# Patient Record
Sex: Male | Born: 1955 | Race: White | Hispanic: No | Marital: Married | Smoking: Former smoker
Health system: Southern US, Community
[De-identification: ages and names within clinical notes are randomized; demographics above are authoritative.]

## PROBLEM LIST (undated history)

## (undated) DIAGNOSIS — K7689 Other specified diseases of liver: Secondary | ICD-10-CM

## (undated) DIAGNOSIS — I2510 Atherosclerotic heart disease of native coronary artery without angina pectoris: Secondary | ICD-10-CM

## (undated) DIAGNOSIS — K5730 Diverticulosis of large intestine without perforation or abscess without bleeding: Secondary | ICD-10-CM

## (undated) DIAGNOSIS — M259 Joint disorder, unspecified: Secondary | ICD-10-CM

## (undated) DIAGNOSIS — K635 Polyp of colon: Secondary | ICD-10-CM

## (undated) DIAGNOSIS — B019 Varicella without complication: Secondary | ICD-10-CM

## (undated) DIAGNOSIS — M1990 Unspecified osteoarthritis, unspecified site: Secondary | ICD-10-CM

## (undated) DIAGNOSIS — N281 Cyst of kidney, acquired: Secondary | ICD-10-CM

## (undated) DIAGNOSIS — I809 Phlebitis and thrombophlebitis of unspecified site: Secondary | ICD-10-CM

## (undated) DIAGNOSIS — G4730 Sleep apnea, unspecified: Secondary | ICD-10-CM

## (undated) DIAGNOSIS — G43909 Migraine, unspecified, not intractable, without status migrainosus: Secondary | ICD-10-CM

## (undated) DIAGNOSIS — E7800 Pure hypercholesterolemia, unspecified: Secondary | ICD-10-CM

## (undated) DIAGNOSIS — I119 Hypertensive heart disease without heart failure: Secondary | ICD-10-CM

## (undated) DIAGNOSIS — K2270 Barrett's esophagus without dysplasia: Secondary | ICD-10-CM

## (undated) DIAGNOSIS — I10 Essential (primary) hypertension: Secondary | ICD-10-CM

## (undated) DIAGNOSIS — T7840XA Allergy, unspecified, initial encounter: Secondary | ICD-10-CM

## (undated) DIAGNOSIS — Z8619 Personal history of other infectious and parasitic diseases: Secondary | ICD-10-CM

## (undated) DIAGNOSIS — K219 Gastro-esophageal reflux disease without esophagitis: Secondary | ICD-10-CM

## (undated) HISTORY — DX: Personal history of other infectious and parasitic diseases: Z86.19

## (undated) HISTORY — PX: APPENDECTOMY: SHX54

## (undated) HISTORY — DX: Cyst of kidney, acquired: N28.1

## (undated) HISTORY — DX: Diverticulosis of large intestine without perforation or abscess without bleeding: K57.30

## (undated) HISTORY — PX: CORONARY ANGIOPLASTY WITH STENT PLACEMENT: SHX49

## (undated) HISTORY — PX: ESOPHAGOGASTRODUODENOSCOPY: SHX1529

## (undated) HISTORY — PX: CHOLECYSTECTOMY: SHX55

## (undated) HISTORY — PX: COLONOSCOPY: SHX174

## (undated) HISTORY — DX: Polyp of colon: K63.5

## (undated) HISTORY — PX: CARDIAC CATHETERIZATION: SHX172

---
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
## 2004-02-15 ENCOUNTER — Ambulatory Visit: Payer: Self-pay | Admitting: Internal Medicine

## 2006-01-28 ENCOUNTER — Other Ambulatory Visit: Payer: Self-pay

## 2006-01-28 ENCOUNTER — Inpatient Hospital Stay: Payer: Self-pay | Admitting: Internal Medicine

## 2006-01-28 DIAGNOSIS — I2510 Atherosclerotic heart disease of native coronary artery without angina pectoris: Secondary | ICD-10-CM

## 2006-01-28 HISTORY — PX: CORONARY STENT INTERVENTION: CATH118234

## 2006-01-28 HISTORY — DX: Atherosclerotic heart disease of native coronary artery without angina pectoris: I25.10

## 2006-01-28 HISTORY — PX: LEFT HEART CATH AND CORONARY ANGIOGRAPHY: CATH118249

## 2006-01-29 ENCOUNTER — Other Ambulatory Visit: Payer: Self-pay

## 2006-03-03 ENCOUNTER — Encounter: Payer: Self-pay | Admitting: Cardiology

## 2006-03-30 ENCOUNTER — Encounter: Payer: Self-pay | Admitting: Cardiology

## 2006-04-30 ENCOUNTER — Encounter: Payer: Self-pay | Admitting: Cardiology

## 2006-05-29 ENCOUNTER — Encounter: Payer: Self-pay | Admitting: Cardiology

## 2006-06-29 ENCOUNTER — Encounter: Payer: Self-pay | Admitting: Cardiology

## 2006-09-17 ENCOUNTER — Ambulatory Visit: Payer: Self-pay | Admitting: Internal Medicine

## 2006-10-29 ENCOUNTER — Ambulatory Visit: Payer: Self-pay | Admitting: Cardiology

## 2007-01-29 HISTORY — PX: CHOLECYSTECTOMY: SHX55

## 2007-02-17 ENCOUNTER — Ambulatory Visit: Payer: Self-pay | Admitting: Surgery

## 2007-11-29 HISTORY — PX: COLONOSCOPY: SHX174

## 2007-12-16 ENCOUNTER — Ambulatory Visit: Payer: Self-pay | Admitting: Gastroenterology

## 2008-06-12 ENCOUNTER — Observation Stay: Payer: Self-pay | Admitting: Internal Medicine

## 2009-05-09 ENCOUNTER — Ambulatory Visit: Payer: Self-pay | Admitting: Gastroenterology

## 2009-07-16 ENCOUNTER — Encounter: Payer: Self-pay | Admitting: Sports Medicine

## 2009-07-28 ENCOUNTER — Encounter: Payer: Self-pay | Admitting: Sports Medicine

## 2009-11-08 ENCOUNTER — Ambulatory Visit: Payer: Self-pay | Admitting: Gastroenterology

## 2009-11-11 LAB — PATHOLOGY REPORT

## 2009-12-19 ENCOUNTER — Ambulatory Visit: Payer: Self-pay | Admitting: Cardiology

## 2011-01-19 ENCOUNTER — Ambulatory Visit: Payer: Self-pay | Admitting: Internal Medicine

## 2011-01-22 ENCOUNTER — Ambulatory Visit: Payer: Self-pay | Admitting: Cardiology

## 2011-02-18 ENCOUNTER — Other Ambulatory Visit: Payer: Self-pay | Admitting: Gastroenterology

## 2011-03-16 ENCOUNTER — Ambulatory Visit: Payer: Self-pay | Admitting: Gastroenterology

## 2012-03-04 ENCOUNTER — Ambulatory Visit: Payer: Self-pay | Admitting: Gastroenterology

## 2013-01-06 ENCOUNTER — Ambulatory Visit: Payer: Self-pay | Admitting: Gastroenterology

## 2013-01-06 HISTORY — PX: UPPER GI ENDOSCOPY: SHX6162

## 2013-01-16 LAB — PATHOLOGY REPORT

## 2015-03-04 ENCOUNTER — Encounter: Payer: Self-pay | Admitting: *Deleted

## 2015-03-05 ENCOUNTER — Ambulatory Visit: Payer: BLUE CROSS/BLUE SHIELD | Admitting: Anesthesiology

## 2015-03-05 ENCOUNTER — Encounter: Payer: Self-pay | Admitting: *Deleted

## 2015-03-05 ENCOUNTER — Encounter
Admission: RE | Disposition: A | Discharge status: Home / Self Care | Payer: Self-pay | Source: Ambulatory Visit | Attending: Gastroenterology

## 2015-03-05 ENCOUNTER — Ambulatory Visit
Admission: RE | Admit: 2015-03-05 | Discharge: 2015-03-05 | Disposition: A | Discharge status: Home / Self Care | Payer: BLUE CROSS/BLUE SHIELD | Source: Ambulatory Visit | Attending: Gastroenterology | Admitting: Gastroenterology

## 2015-03-05 DIAGNOSIS — K449 Diaphragmatic hernia without obstruction or gangrene: Secondary | ICD-10-CM | POA: Diagnosis not present

## 2015-03-05 DIAGNOSIS — Z7982 Long term (current) use of aspirin: Secondary | ICD-10-CM | POA: Diagnosis not present

## 2015-03-05 DIAGNOSIS — Z79899 Other long term (current) drug therapy: Secondary | ICD-10-CM | POA: Diagnosis not present

## 2015-03-05 DIAGNOSIS — K2970 Gastritis, unspecified, without bleeding: Secondary | ICD-10-CM | POA: Insufficient documentation

## 2015-03-05 DIAGNOSIS — E7800 Pure hypercholesterolemia, unspecified: Secondary | ICD-10-CM | POA: Diagnosis not present

## 2015-03-05 DIAGNOSIS — I10 Essential (primary) hypertension: Secondary | ICD-10-CM | POA: Diagnosis not present

## 2015-03-05 DIAGNOSIS — K219 Gastro-esophageal reflux disease without esophagitis: Secondary | ICD-10-CM | POA: Insufficient documentation

## 2015-03-05 DIAGNOSIS — I2510 Atherosclerotic heart disease of native coronary artery without angina pectoris: Secondary | ICD-10-CM | POA: Diagnosis not present

## 2015-03-05 DIAGNOSIS — K2270 Barrett's esophagus without dysplasia: Secondary | ICD-10-CM | POA: Diagnosis present

## 2015-03-05 HISTORY — DX: Joint disorder, unspecified: M25.9

## 2015-03-05 HISTORY — PX: ESOPHAGOGASTRODUODENOSCOPY (EGD) WITH PROPOFOL: SHX5813

## 2015-03-05 HISTORY — DX: Gastro-esophageal reflux disease without esophagitis: K21.9

## 2015-03-05 HISTORY — DX: Hypertensive heart disease without heart failure: I11.9

## 2015-03-05 HISTORY — DX: Phlebitis and thrombophlebitis of unspecified site: I80.9

## 2015-03-05 HISTORY — DX: Migraine, unspecified, not intractable, without status migrainosus: G43.909

## 2015-03-05 HISTORY — DX: Pure hypercholesterolemia, unspecified: E78.00

## 2015-03-05 HISTORY — DX: Barrett's esophagus without dysplasia: K22.70

## 2015-03-05 HISTORY — DX: Other specified diseases of liver: K76.89

## 2015-03-05 HISTORY — DX: Allergy, unspecified, initial encounter: T78.40XA

## 2015-03-05 HISTORY — DX: Varicella without complication: B01.9

## 2015-03-05 HISTORY — DX: Atherosclerotic heart disease of native coronary artery without angina pectoris: I25.10

## 2015-03-05 HISTORY — DX: Essential (primary) hypertension: I10

## 2015-03-05 HISTORY — DX: Cyst of kidney, acquired: N28.1

## 2015-03-05 SURGERY — ESOPHAGOGASTRODUODENOSCOPY (EGD) WITH PROPOFOL
Anesthesia: General

## 2015-03-05 MED ORDER — GLYCOPYRROLATE 0.2 MG/ML IJ SOLN
INTRAMUSCULAR | Status: DC | PRN
  Administered 2015-03-05: 0.2 mg via INTRAVENOUS

## 2015-03-05 MED ORDER — PROPOFOL 10 MG/ML IV BOLUS
INTRAVENOUS | Status: DC | PRN
  Administered 2015-03-05: 40 mg via INTRAVENOUS
  Administered 2015-03-05: 30 mg via INTRAVENOUS
  Administered 2015-03-05: 50 mg via INTRAVENOUS
  Administered 2015-03-05: 20 mg via INTRAVENOUS

## 2015-03-05 MED ORDER — LIDOCAINE HCL (CARDIAC) 20 MG/ML IV SOLN
INTRAVENOUS | Status: DC | PRN
  Administered 2015-03-05: 60 mg via INTRAVENOUS

## 2015-03-05 MED ORDER — SODIUM CHLORIDE 0.9 % IV SOLN
INTRAVENOUS | Status: DC
  Administered 2015-03-05: 1000 mL via INTRAVENOUS

## 2015-03-05 MED ORDER — MIDAZOLAM HCL 2 MG/2ML IJ SOLN
INTRAMUSCULAR | Status: DC | PRN
  Administered 2015-03-05: 2 mg via INTRAVENOUS

## 2015-03-05 MED ORDER — SODIUM CHLORIDE 0.9 % IV SOLN: INTRAVENOUS | Status: DC

## 2015-03-05 NOTE — Transfer of Care (Signed)
Immediate Anesthesia Transfer of Care Note  Patient: JOBEY CONG  Procedure(s) Performed: Procedure(s): ESOPHAGOGASTRODUODENOSCOPY (EGD) WITH PROPOFOL (N/A)  Patient Location: Endoscopy Unit  Anesthesia Type:General  Level of Consciousness: awake, alert , oriented and patient cooperative  Airway & Oxygen Therapy: Patient Spontanous Breathing and Patient connected to nasal cannula oxygen  Post-op Assessment: Report given to RN, Post -op Vital signs reviewed and stable and Patient moving all extremities X 4  Post vital signs: Reviewed and stable  Last Vitals:  Filed Vitals:   03/05/15 1437  BP: 140/78  Pulse: 61  Temp: 35.8 C  Resp: 18    Complications: No apparent anesthesia complications

## 2015-03-05 NOTE — Anesthesia Postprocedure Evaluation (Signed)
Anesthesia Post Note  Patient: Derek Olsen  Procedure(s) Performed: Procedure(s) (LRB): ESOPHAGOGASTRODUODENOSCOPY (EGD) WITH PROPOFOL (N/A)  Patient location during evaluation: PACU Anesthesia Type: General Pain management: satisfactory to patient Vital Signs Assessment: post-procedure vital signs reviewed and stable Respiratory status: respiratory function stable Cardiovascular status: stable Anesthetic complications: no    Last Vitals:  Filed Vitals:   03/05/15 1437 03/05/15 1640  BP: 140/78 103/68  Pulse: 61 73  Temp: 35.8 C 35.9 C  Resp: 18 15    Last Pain:  Filed Vitals:   03/05/15 1642  PainSc: 0-No pain                 VAN STAVEREN,Linkin Vizzini

## 2015-03-05 NOTE — Op Note (Signed)
[facility] Gastroenterology Patient Name: Derek Olsen Procedure Date: 03/05/2015 4:01 PM MRN: [id number] Account #: [id number] Date of Birth: May 08, 1955 Admit Type: Outpatient Age: 59 Room: [facility] ENDO ROOM 3 Gender: Male Note Status: Finalized Procedure:         Upper GI endoscopy Indications:       Follow-up of Barrett's esophagus Providers:         Lollie Sails, MD Referring MD:      Ramonita Lab, MD (Referring MD) Medicines:         Monitored Anesthesia Care Complications:     No immediate complications. Procedure:         Pre-Anesthesia Assessment:                    - ASA Grade Assessment: III - A patient with severe                     systemic disease.                    After obtaining informed consent, the endoscope was passed                     under direct vision. Throughout the procedure, the                     patient's blood pressure, pulse, and oxygen saturations                     were monitored continuously. The Endoscope was introduced                     through the mouth, and advanced to the third part of                     duodenum. The upper GI endoscopy was accomplished without                     difficulty. The patient tolerated the procedure well. Findings:      There were esophageal mucosal changes secondary to established       long-segment Barrett's disease present in the lower third of the       esophagus. The maximum longitudinal extent of these mucosal changes was       9 cm in length. Mucosa was biopsied with a cold forceps for histology in       4 quadrants at intervals of 2 cm at 24, 25, 27, 29, 31 and 33 cm from       the incisors and placed in separate labeled jars.      A medium-sized hiatus hernia was found. The Z-line was a variable       distance from incisors; the hiatal hernia was sliding.      Patchy mild inflammation characterized by erythema was found in the       gastric body. Biopsies were taken with a  cold forceps for histology.       Biopsies were taken with a cold forceps for Helicobacter pylori testing.      The cardia and gastric fundus were normal on retroflexion otherwise.      The examined duodenum was normal. Impression:        - Esophageal mucosal changes secondary to established  long-segment Barrett's disease. Biopsied.                    - Medium-sized hiatus hernia.                    - Gastritis. Biopsied.                    - Normal examined duodenum. Recommendation:    - Await pathology results.                    - Continue present medications. Procedure Code(s): --- Professional ---                    [phone], Esophagogastroduodenoscopy, flexible, transoral;                     with biopsy, single or multiple Diagnosis Code(s): --- Professional ---                    K22.70, Barrett's esophagus without dysplasia                    K44.9, Diaphragmatic hernia without obstruction or gangrene                    K29.70, Gastritis, unspecified, without bleeding CPT copyright 2014 American Medical Association. All rights reserved. The codes documented in this report are preliminary and upon coder review may  be revised to meet current compliance requirements. Lollie Sails, MD 03/05/2015 4:39:09 PM This report has been signed electronically. Number of Addenda: 0 Note Initiated On: 03/05/2015 4:01 PM      [facility]

## 2015-03-05 NOTE — H&P (Signed)
Outpatient short stay form Pre-procedure 03/05/2015 4:00 PM Derek Sails MD  Primary Physician: Dr. Ramonita Lab  Reason for visit:  EGD  History of present illness:  Patient is a 59 year old male with a personal history of Barrett's esophagus. His last EGD was about 2 years ago. He has long segment Barrett's esophagus. He denies any problems with heartburn or dysphagia. He does take Protonix 40 mg daily which seem to control any of these symptoms.  He does take 81 mg aspirin as well as Plavix and he is held those for about 6 days.    Current facility-administered medications:  .  0.9 %  sodium chloride infusion, , Intravenous, Continuous, Derek Sails, MD, Last Rate: 20 mL/hr at 03/05/15 1505, 1,000 mL at 03/05/15 1505 .  0.9 %  sodium chloride infusion, , Intravenous, Continuous, Derek Sails, MD  Prescriptions prior to admission  Medication Sig Dispense Refill Last Dose  . acetaminophen (TYLENOL) 500 MG tablet Take 500 mg by mouth every 4 (four) hours as needed.   03/04/2015 at Unknown time  . aspirin 81 MG tablet Take 81 mg by mouth daily.   Past Week at Unknown time  . fluocinonide (LIDEX) 0.05 % external solution Apply 1 application topically 2 (two) times daily.   03/04/2015 at Unknown time  . Multiple Vitamin (MULTIVITAMIN) tablet Take 1 tablet by mouth daily.   03/04/2015 at Unknown time  . naproxen sodium (ANAPROX) 220 MG tablet Take 220 mg by mouth 2 (two) times daily with a meal.   Past Week at Unknown time  . pantoprazole (PROTONIX) 40 MG tablet Take 40 mg by mouth daily.   03/04/2015 at Unknown time  . valsartan-hydrochlorothiazide (DIOVAN-HCT) 160-25 MG tablet Take 1 tablet by mouth 2 (two) times daily.   03/05/2015 at 0700     Allergies  Allergen Reactions  . Crestor [Rosuvastatin Calcium]   . Demerol [Meperidine]   . Lipitor [Atorvastatin]   . Lopid [Gemfibrozil]   . Morphine And Related      Past Medical History  Diagnosis Date  . Allergic state   .  Chickenpox   . CAD (coronary artery disease)   . Joint disorder of shoulder Left Shoulder  . GERD (gastroesophageal reflux disease)   . Hepatic cyst   . Hypercholesterolemia   . Hypertension   . Hypertensive cardiovascular disease   . Migraines   . Phlebitis   . Renal cyst Right    Review of systems:      Physical Exam    Heart and lungs: Regular rate and rhythm without rub or gallop. Lungs are bilaterally clear    HEENT: Normocephalic atraumatic eyes are anicteric    Other:     Pertinant exam for procedure: Soft nontender nondistended bowel sounds positive normoactive    Planned proceedures: EGD and indicated procedures. I have discussed the risks benefits and complications of procedures to include not limited to bleeding, infection, perforation and the risk of sedation and the patient wishes to proceed.    Derek Sails, MD Gastroenterology 03/05/2015  4:00 PM

## 2015-03-05 NOTE — Anesthesia Preprocedure Evaluation (Addendum)
Anesthesia Evaluation  Patient identified by MRN, date of birth, ID band Patient awake    Reviewed: Allergy & Precautions, NPO status , Patient's Chart, lab work & pertinent test results, reviewed documented beta blocker date and time   Airway Mallampati: II  TM Distance: >3 FB     Dental  (+) Chipped   Pulmonary neg pulmonary ROS, former smoker,    Pulmonary exam normal        Cardiovascular hypertension, Pt. on medications + CAD  Normal cardiovascular exam     Neuro/Psych  Headaches,    GI/Hepatic GERD  ,  Endo/Other    Renal/GU Renal InsufficiencyRenal disease     Musculoskeletal   Abdominal Normal abdominal exam  (+)   Peds  Hematology   Anesthesia Other Findings   Reproductive/Obstetrics                            Anesthesia Physical Anesthesia Plan  ASA: II  Anesthesia Plan: General   Post-op Pain Management:    Induction: Intravenous  Airway Management Planned: Nasal Cannula  Additional Equipment:   Intra-op Plan:   Post-operative Plan:   Informed Consent: I have reviewed the patients History and Physical, chart, labs and discussed the procedure including the risks, benefits and alternatives for the proposed anesthesia with the patient or authorized representative who has indicated his/her understanding and acceptance.     Plan Discussed with: CRNA  Anesthesia Plan Comments:         Anesthesia Quick Evaluation

## 2015-03-06 ENCOUNTER — Encounter: Payer: Self-pay | Admitting: Gastroenterology

## 2015-03-07 LAB — SURGICAL PATHOLOGY

## 2015-12-09 HISTORY — PX: MYOCARDIAL PERFUSION IMAGING: CAR2012

## 2015-12-15 DIAGNOSIS — G4733 Obstructive sleep apnea (adult) (pediatric): Secondary | ICD-10-CM

## 2015-12-15 HISTORY — DX: Obstructive sleep apnea (adult) (pediatric): G47.33

## 2017-05-03 ENCOUNTER — Encounter: Payer: Self-pay | Admitting: Emergency Medicine

## 2017-05-03 ENCOUNTER — Emergency Department
Admission: EM | Admit: 2017-05-03 | Discharge: 2017-05-03 | Disposition: A | Discharge status: Home / Self Care | Payer: BLUE CROSS/BLUE SHIELD | Attending: Emergency Medicine | Admitting: Emergency Medicine

## 2017-05-03 DIAGNOSIS — Z23 Encounter for immunization: Secondary | ICD-10-CM | POA: Insufficient documentation

## 2017-05-03 DIAGNOSIS — Z7982 Long term (current) use of aspirin: Secondary | ICD-10-CM | POA: Diagnosis not present

## 2017-05-03 DIAGNOSIS — Y93G1 Activity, food preparation and clean up: Secondary | ICD-10-CM | POA: Diagnosis not present

## 2017-05-03 DIAGNOSIS — I119 Hypertensive heart disease without heart failure: Secondary | ICD-10-CM | POA: Insufficient documentation

## 2017-05-03 DIAGNOSIS — Z79899 Other long term (current) drug therapy: Secondary | ICD-10-CM | POA: Insufficient documentation

## 2017-05-03 DIAGNOSIS — S61012A Laceration without foreign body of left thumb without damage to nail, initial encounter: Secondary | ICD-10-CM

## 2017-05-03 DIAGNOSIS — Z87891 Personal history of nicotine dependence: Secondary | ICD-10-CM | POA: Insufficient documentation

## 2017-05-03 DIAGNOSIS — I2510 Atherosclerotic heart disease of native coronary artery without angina pectoris: Secondary | ICD-10-CM | POA: Diagnosis not present

## 2017-05-03 DIAGNOSIS — Y929 Unspecified place or not applicable: Secondary | ICD-10-CM | POA: Diagnosis not present

## 2017-05-03 DIAGNOSIS — Y999 Unspecified external cause status: Secondary | ICD-10-CM | POA: Diagnosis not present

## 2017-05-03 DIAGNOSIS — W260XXA Contact with knife, initial encounter: Secondary | ICD-10-CM | POA: Diagnosis not present

## 2017-05-03 DIAGNOSIS — S61011A Laceration without foreign body of right thumb without damage to nail, initial encounter: Secondary | ICD-10-CM | POA: Insufficient documentation

## 2017-05-03 MED ORDER — LIDOCAINE HCL (PF) 1 % IJ SOLN
INTRAMUSCULAR | Status: AC
  Filled 2017-05-03: qty 5

## 2017-05-03 MED ORDER — LIDOCAINE HCL (PF) 1 % IJ SOLN
5.0000 mL | Freq: Once | INTRAMUSCULAR | Status: AC
  Administered 2017-05-03: 5 mL via INTRADERMAL
  Filled 2017-05-03: qty 5

## 2017-05-03 MED ORDER — BACITRACIN ZINC 500 UNIT/GM EX OINT
1.0000 "application " | TOPICAL_OINTMENT | Freq: Once | CUTANEOUS | Status: AC
  Administered 2017-05-03: 1 via TOPICAL
  Filled 2017-05-03: qty 0.9

## 2017-05-03 MED ORDER — TETANUS-DIPHTH-ACELL PERTUSSIS 5-2.5-18.5 LF-MCG/0.5 IM SUSP
0.5000 mL | Freq: Once | INTRAMUSCULAR | Status: AC
  Administered 2017-05-03: 0.5 mL via INTRAMUSCULAR
  Filled 2017-05-03: qty 0.5

## 2017-05-03 NOTE — Discharge Instructions (Signed)
Do not get the sutured area wet for 24 hours. After 24 hours, shower/bathe as usual and pat the area dry. Change the bandage 2 times per day and apply antibiotic ointment. Leave open to air when at no risk of getting the area dirty, but cover at night before bed.   

## 2017-05-03 NOTE — ED Triage Notes (Signed)
Cut right thumb with knife while cutting up an onion.

## 2017-05-03 NOTE — ED Provider Notes (Signed)
[facility] Emergency Department Provider Note  ____________________________________________  Time seen: Approximately 7:57 PM  I have reviewed the triage vital signs and the nursing notes.   HISTORY  Chief Complaint Laceration   HPI Derek Olsen is a 62 y.o. male who presents to the emergency department for laceration repair. Patient was cutting an onion and the knife slipped which cut the tip of his right thumb.  He is on Plavix and initially, the bleeding was profuse.  Wife applied a pressure dressing which has brought the bleeding under control.  Patient is not sure of the last tetanus vaccination.  Past Medical History:  Diagnosis Date  . Allergic state   . CAD (coronary artery disease)   . Chickenpox   . GERD (gastroesophageal reflux disease)   . Hepatic cyst   . Hypercholesterolemia   . Hypertension   . Hypertensive cardiovascular disease   . Joint disorder of shoulder Left Shoulder  . Migraines   . Phlebitis   . Renal cyst Right    There are no active problems to display for this patient.   Past Surgical History:  Procedure Laterality Date  . CARDIAC CATHETERIZATION    . CHOLECYSTECTOMY    . COLONOSCOPY    . CORONARY ANGIOPLASTY WITH STENT PLACEMENT    . ESOPHAGOGASTRODUODENOSCOPY    . ESOPHAGOGASTRODUODENOSCOPY (EGD) WITH PROPOFOL N/A 03/05/2015   Procedure: ESOPHAGOGASTRODUODENOSCOPY (EGD) WITH PROPOFOL;  Surgeon: Lollie Sails, MD;  Location: [facility] ENDOSCOPY;  Service: Endoscopy;  Laterality: N/A;    Prior to Admission medications   Medication Sig Start Date End Date Taking? Authorizing Provider  acetaminophen (TYLENOL) 500 MG tablet Take 500 mg by mouth every 4 (four) hours as needed.    [provider]  aspirin 81 MG tablet Take 81 mg by mouth daily.    [provider]  fluocinonide (LIDEX) 0.05 % external solution Apply 1 application topically 2 (two) times daily.    [provider]  Multiple  Vitamin (MULTIVITAMIN) tablet Take 1 tablet by mouth daily.    [provider]  naproxen sodium (ANAPROX) 220 MG tablet Take 220 mg by mouth 2 (two) times daily with a meal.    [provider]  pantoprazole (PROTONIX) 40 MG tablet Take 40 mg by mouth daily.    [provider]  valsartan-hydrochlorothiazide (DIOVAN-HCT) 160-25 MG tablet Take 1 tablet by mouth 2 (two) times daily.    [provider]    Allergies Crestor [rosuvastatin calcium]; Demerol [meperidine]; Lipitor [atorvastatin]; Lopid [gemfibrozil]; and Morphine and related  No family history on file.  Social History Social History   Tobacco Use  . Smoking status: Former Smoker    Packs/day: 0.50    Years: 0.50    Pack years: 0.25    Types: Cigarettes  . Smokeless tobacco: Never Used  Substance Use Topics  . Alcohol use: No  . Drug use: No    Review of Systems  Constitutional: Negative for fever. Respiratory: Negative for cough or shortness of breath.  Musculoskeletal: Negative for myalgias Skin: Positive for laceration to the tip of the left thumb. Neurological: Negative for numbness or paresthesias. ____________________________________________   PHYSICAL EXAM:  VITAL SIGNS: ED Triage Vitals  Enc Vitals Group     BP 05/03/17 1940 (!) 151/100     Pulse Rate 05/03/17 1940 68     Resp 05/03/17 1940 18     Temp 05/03/17 1940 98.6 F (37 C)     Temp Source 05/03/17 1940  Oral     SpO2 05/03/17 1940 98 %     Weight 05/03/17 1844 140 lb (63.5 kg)     Height 05/03/17 1844 5\' 7"  (1.702 m)     Head Circumference --      Peak Flow --      Pain Score 05/03/17 1844 0     Pain Loc --      Pain Edu? --      Excl. in [place]? --      Constitutional: Well appearing. Eyes: Conjunctivae are clear without discharge or drainage. Nose: No rhinorrhea noted. Mouth/Throat: Airway is patent.  Neck: No stridor. Unrestricted range of motion observed.  Cardiovascular: Capillary refill is <3  seconds.  Respiratory: Respirations are even and unlabored.. Musculoskeletal: Unrestricted range of motion observed. Neurologic: Awake, alert, and oriented x 4.  Skin: 2 cm flap laceration to the tip of the left thumb without nail bed involvement.  Small amount of active bleeding is noted.  ____________________________________________   LABS (all labs ordered are listed, but only abnormal results are displayed)  Labs Reviewed - No data to display ____________________________________________  EKG  Not indicated ____________________________________________  RADIOLOGY  Not indicated ____________________________________________   PROCEDURES  .Marland KitchenLaceration Repair Date/Time: 05/04/2017 4:14 AM Performed by: Victorino Dike, FNP Authorized by: Victorino Dike, FNP   Consent:    Consent obtained:  Verbal   Consent given by:  Patient   Risks discussed:  Infection, pain and poor wound healing Anesthesia (see MAR for exact dosages):    Anesthesia method:  Local infiltration   Local anesthetic:  Lidocaine 1% w/o epi Laceration details:    Location:  Finger   Finger location:  L thumb   Length (cm):  2 Repair type:    Repair type:  Simple Exploration:    Hemostasis achieved with:  Direct pressure   Wound exploration: entire depth of wound probed and visualized     Contaminated: no   Treatment:    Area cleansed with:  Saline and Betadine   Amount of cleaning:  Extensive   Irrigation solution:  Sterile saline   Visualized foreign bodies/material removed: no   Skin repair:    Repair method:  Sutures   Suture size:  5-0   Suture material:  Nylon   Suture technique:  Simple interrupted   Number of sutures:  3 Approximation:    Approximation:  Close   [place] border: well-aligned   Post-procedure details:    Dressing:  Sterile dressing and antibiotic ointment   Patient tolerance of procedure:  Tolerated well, no immediate  complications   ____________________________________________   INITIAL IMPRESSION / ASSESSMENT AND PLAN / ED COURSE  Derek Olsen is a 62 y.o. male presents to the emergency department for laceration repair.  Patient tolerated the procedure well.  He was instructed to have the sutures removed by his primary care provider in 10-12 days.  He was instructed to see the primary care provider sooner for symptoms of concern.  He is to return to the emergency department if he is unable to schedule an appointment if unable to schedule an appointment with primary care.  Medications  Tdap (BOOSTRIX) injection 0.5 mL (0.5 mLs Intramuscular Given 05/03/17 2019)  lidocaine (PF) (XYLOCAINE) 1 % injection 5 mL (5 mLs Intradermal Given 05/03/17 2019)  bacitracin ointment 1 application (1 application Topical Given 05/03/17 2059)     Pertinent labs & imaging results that were available during my care of the patient were reviewed by  me and considered in my medical decision making (see chart for details). ____________________________________________   FINAL CLINICAL IMPRESSION(S) / ED DIAGNOSES  Final diagnoses:  Laceration of left thumb without foreign body without damage to nail, initial encounter    ED Discharge Orders    None       Note:  This document was prepared using Dragon voice recognition software and may include unintentional dictation errors.    Victorino Dike, FNP 05/04/17 5790    Delman Kitten, MD 05/14/17 1056

## 2017-06-01 ENCOUNTER — Ambulatory Visit: Payer: BLUE CROSS/BLUE SHIELD | Admitting: Anesthesiology

## 2017-06-01 ENCOUNTER — Ambulatory Visit
Admission: RE | Admit: 2017-06-01 | Discharge: 2017-06-01 | Disposition: A | Discharge status: Home / Self Care | Payer: BLUE CROSS/BLUE SHIELD | Source: Ambulatory Visit | Attending: Gastroenterology | Admitting: Gastroenterology

## 2017-06-01 ENCOUNTER — Encounter
Admission: RE | Disposition: A | Discharge status: Home / Self Care | Payer: Self-pay | Source: Ambulatory Visit | Attending: Gastroenterology

## 2017-06-01 ENCOUNTER — Encounter: Payer: Self-pay | Admitting: *Deleted

## 2017-06-01 DIAGNOSIS — Z7982 Long term (current) use of aspirin: Secondary | ICD-10-CM | POA: Insufficient documentation

## 2017-06-01 DIAGNOSIS — I119 Hypertensive heart disease without heart failure: Secondary | ICD-10-CM | POA: Diagnosis not present

## 2017-06-01 DIAGNOSIS — E7800 Pure hypercholesterolemia, unspecified: Secondary | ICD-10-CM | POA: Insufficient documentation

## 2017-06-01 DIAGNOSIS — I2510 Atherosclerotic heart disease of native coronary artery without angina pectoris: Secondary | ICD-10-CM | POA: Insufficient documentation

## 2017-06-01 DIAGNOSIS — K7689 Other specified diseases of liver: Secondary | ICD-10-CM | POA: Insufficient documentation

## 2017-06-01 DIAGNOSIS — G4730 Sleep apnea, unspecified: Secondary | ICD-10-CM | POA: Insufficient documentation

## 2017-06-01 DIAGNOSIS — K2270 Barrett's esophagus without dysplasia: Secondary | ICD-10-CM | POA: Insufficient documentation

## 2017-06-01 DIAGNOSIS — Z1211 Encounter for screening for malignant neoplasm of colon: Secondary | ICD-10-CM | POA: Insufficient documentation

## 2017-06-01 DIAGNOSIS — D121 Benign neoplasm of appendix: Secondary | ICD-10-CM | POA: Diagnosis not present

## 2017-06-01 DIAGNOSIS — K449 Diaphragmatic hernia without obstruction or gangrene: Secondary | ICD-10-CM | POA: Diagnosis not present

## 2017-06-01 DIAGNOSIS — K5730 Diverticulosis of large intestine without perforation or abscess without bleeding: Secondary | ICD-10-CM | POA: Insufficient documentation

## 2017-06-01 DIAGNOSIS — Z79899 Other long term (current) drug therapy: Secondary | ICD-10-CM | POA: Diagnosis not present

## 2017-06-01 DIAGNOSIS — K219 Gastro-esophageal reflux disease without esophagitis: Secondary | ICD-10-CM | POA: Insufficient documentation

## 2017-06-01 DIAGNOSIS — Z885 Allergy status to narcotic agent status: Secondary | ICD-10-CM | POA: Diagnosis not present

## 2017-06-01 DIAGNOSIS — K2970 Gastritis, unspecified, without bleeding: Secondary | ICD-10-CM | POA: Insufficient documentation

## 2017-06-01 HISTORY — DX: Barrett's esophagus without dysplasia: K22.70

## 2017-06-01 HISTORY — PX: ESOPHAGOGASTRODUODENOSCOPY (EGD) WITH PROPOFOL: SHX5813

## 2017-06-01 HISTORY — DX: Unspecified osteoarthritis, unspecified site: M19.90

## 2017-06-01 HISTORY — PX: COLONOSCOPY WITH PROPOFOL: SHX5780

## 2017-06-01 HISTORY — DX: Sleep apnea, unspecified: G47.30

## 2017-06-01 SURGERY — COLONOSCOPY WITH PROPOFOL
Anesthesia: General

## 2017-06-01 MED ORDER — PHENYLEPHRINE HCL 10 MG/ML IJ SOLN
INTRAMUSCULAR | Status: DC | PRN
  Administered 2017-06-01: 100 ug via INTRAVENOUS
  Administered 2017-06-01: 50 ug via INTRAVENOUS

## 2017-06-01 MED ORDER — GLYCOPYRROLATE 0.2 MG/ML IJ SOLN
INTRAMUSCULAR | Status: DC | PRN
  Administered 2017-06-01: 0.2 mg via INTRAVENOUS

## 2017-06-01 MED ORDER — GLYCOPYRROLATE 0.2 MG/ML IJ SOLN
INTRAMUSCULAR | Status: AC
  Filled 2017-06-01: qty 1

## 2017-06-01 MED ORDER — PROPOFOL 10 MG/ML IV BOLUS
INTRAVENOUS | Status: DC | PRN
  Administered 2017-06-01 (×2): 100 mg via INTRAVENOUS

## 2017-06-01 MED ORDER — SODIUM CHLORIDE 0.9 % IV SOLN: INTRAVENOUS | Status: DC

## 2017-06-01 MED ORDER — SODIUM CHLORIDE 0.9 % IV SOLN
INTRAVENOUS | Status: DC
  Administered 2017-06-01: 1000 mL via INTRAVENOUS

## 2017-06-01 MED ORDER — PROPOFOL 500 MG/50ML IV EMUL
INTRAVENOUS | Status: AC
  Filled 2017-06-01: qty 50

## 2017-06-01 MED ORDER — PROPOFOL 500 MG/50ML IV EMUL
INTRAVENOUS | Status: DC | PRN
  Administered 2017-06-01: 100 ug/kg/min via INTRAVENOUS

## 2017-06-01 NOTE — H&P (Signed)
Outpatient short stay form Pre-procedure 06/01/2017 2:17 PM Lollie Sails MD  Primary Physician: Dr. Fulton Reek  Reason for visit: EGD and colonoscopy  History of present illness: Patient is a 62 year old male presenting today as above.  He has personal history of Barrett's esophagus, long segment, without dysplasia.  Presents today for routine follow-up.  He also is presenting for a screening colonoscopy.  Spent about 10 years since his last colonoscopy.  Patient does take blood thinners however has not taken either the Plavix or aspirin or any other aspirin products for 6-7 days.  He tolerated his prep well.  He has been having some increased reflux symptoms recently and we did discuss timing of his PPI in regards to meals as well as in regards to his thyroid medication.  Also adding a evening dose of H2 receptor antagonist with his evening meal.    Current Facility-Administered Medications:  .  0.9 %  sodium chloride infusion, , Intravenous, Continuous, Lollie Sails, MD, Last Rate: 20 mL/hr at 06/01/17 1304, 1,000 mL at 06/01/17 1304 .  0.9 %  sodium chloride infusion, , Intravenous, Continuous, Lollie Sails, MD  Medications Prior to Admission  Medication Sig Dispense Refill Last Dose  . acetaminophen (TYLENOL) 500 MG tablet Take 500 mg by mouth every 4 (four) hours as needed.   05/31/2017 at Unknown time  . aspirin 81 MG tablet Take 81 mg by mouth daily.   Past Week at Unknown time  . clopidogrel (PLAVIX) 75 MG tablet Take 75 mg by mouth daily.   Past Week at Unknown time  . fluocinonide (LIDEX) 0.05 % external solution Apply 1 application topically 2 (two) times daily.   Past Month at Unknown time  . Ibuprofen (MOTRIN PO) Take by mouth at bedtime as needed.   Past Week at Unknown time  . levothyroxine (SYNTHROID, LEVOTHROID) 25 MCG tablet Take 25 mcg by mouth daily before breakfast.   06/01/2017 at Unknown time  . Menthol, Topical Analgesic, (ICY HOT EX) Apply  topically.   Past Week at Unknown time  . Multiple Vitamin (MULTIVITAMIN) tablet Take 1 tablet by mouth daily.   Past Week at Unknown time  . naproxen sodium (ANAPROX) 220 MG tablet Take 220 mg by mouth 2 (two) times daily with a meal.   Past Week at Unknown time  . omega-3 acid ethyl esters (LOVAZA) 1 g capsule Take by mouth 2 (two) times daily.   Past Week at Unknown time  . pantoprazole (PROTONIX) 40 MG tablet Take 40 mg by mouth daily.   Past Week at Unknown time  . ranitidine (ZANTAC) 150 MG tablet Take 150 mg by mouth at bedtime.   Past Week at Unknown time  . trolamine salicylate (ASPERCREME) 10 % cream Apply 1 application topically as needed for muscle pain.   Past Week at Unknown time  . valsartan-hydrochlorothiazide (DIOVAN-HCT) 160-25 MG tablet Take 1 tablet by mouth 2 (two) times daily.   05/31/2017 at Unknown time     Allergies  Allergen Reactions  . Crestor [Rosuvastatin Calcium]   . Demerol [Meperidine]   . Lipitor [Atorvastatin]   . Lopid [Gemfibrozil]   . Morphine And Related      Past Medical History:  Diagnosis Date  . Allergic state   . Arthritis   . Barrett's esophagus   . CAD (coronary artery disease)   . Chickenpox   . GERD (gastroesophageal reflux disease)   . Hepatic cyst   . Hypercholesterolemia   .  Hypertension   . Hypertensive cardiovascular disease   . Joint disorder of shoulder Left Shoulder  . Migraines   . Phlebitis   . Phlebitis   . Renal cyst Right  . Renal cyst   . Sleep apnea     Review of systems:      Physical Exam    Heart and lungs: Regular rate and rhythm without rub or gallop, lungs are bilaterally clear.    HEENT: Normocephalic atraumatic eyes are anicteric    Other:    Pertinant exam for procedure: Soft nontender nondistended bowel sounds positive normoactive.    Planned proceedures: EGD, colonoscopy and indicated procedures. I have discussed the risks benefits and complications of procedures to include not limited to  bleeding, infection, perforation and the risk of sedation and the patient wishes to proceed.    Lollie Sails, MD Gastroenterology 06/01/2017  2:17 PM

## 2017-06-01 NOTE — Op Note (Signed)
[facility] Patient Name: Derek Olsen Procedure Date: 06/01/2017 2:18 PM MRN: [id number] Account #: [id number] Date of Birth: January 02, 1956 Admit Type: Outpatient Age: 62 Room: [facility] ENDO ROOM 3 Gender: Male Note Status: Finalized Procedure:            Colonoscopy Indications:          Screening for colorectal malignant neoplasm Providers:            Lollie Sails, MD Referring MD:         Ramonita Lab, MD (Referring MD) Medicines:            Monitored Anesthesia Care Complications:        No immediate complications. Procedure:            Pre-Anesthesia Assessment:                       - ASA Grade Assessment: III - A patient with severe                        systemic disease.                       After obtaining informed consent, the colonoscope was                        passed under direct vision. Throughout the procedure,                        the patient's blood pressure, pulse, and oxygen                        saturations were monitored continuously. The                        Colonoscope was introduced through the anus and                        advanced to the the cecum, identified by appendiceal                        orifice and ileocecal valve. The colonoscopy was                        performed with moderate difficulty due to a tortuous                        colon. Successful completion of the procedure was aided                        by changing the patient to a prone position and using                        manual pressure. The patient tolerated the procedure                        well. The quality of the bowel preparation was fair. Findings:      A 1 mm polyp was found in the appendiceal orifice. The polyp was       sessile. The polyp was removed with a cold biopsy forceps. Resection and  retrieval were complete.      Multiple small and large-mouthed diverticula were found in the sigmoid       colon, descending colon  and transverse colon.      The digital rectal exam was normal.      The retroflexed view of the distal rectum and anal verge was normal and       showed no anal or rectal abnormalities. Impression:           - Preparation of the colon was fair.                       - One 1 mm polyp at the appendiceal orifice, removed                        with a cold biopsy forceps. Resected and retrieved.                       - Diverticulosis in the sigmoid colon, in the                        descending colon and in the transverse colon.                       - The distal rectum and anal verge are normal on                        retroflexion view. Recommendation:       - Discharge patient to home.                       - Telephone GI clinic for pathology results in 1 week. Procedure Code(s):    --- Professional ---                       [phone], Colonoscopy, flexible; with biopsy, single or                        multiple Diagnosis Code(s):    --- Professional ---                       Z12.11, Encounter for screening for malignant neoplasm                        of colon                       D12.1, Benign neoplasm of appendix                       K57.30, Diverticulosis of large intestine without                        perforation or abscess without bleeding CPT copyright 2016 American Medical Association. All rights reserved. The codes documented in this report are preliminary and upon coder review may  be revised to meet current compliance requirements. Lollie Sails, MD 06/01/2017 3:37:22 PM This report has been signed electronically. Number of Addenda: 0 Note Initiated On: 06/01/2017 2:18 PM Scope Withdrawal Time: 0 hours 13 minutes 52 seconds  Total Procedure Duration: 0 hours 25 minutes 10 seconds       [place]  [facility]

## 2017-06-01 NOTE — Op Note (Signed)
[facility] Patient Name: Derek Olsen Procedure Date: 06/01/2017 2:19 PM MRN: [id number] Account #: [id number] Date of Birth: 10-25-1955 Admit Type: Outpatient Age: 62 Room: [facility] ENDO ROOM 3 Gender: Male Note Status: Finalized Procedure:            Upper GI endoscopy Indications:          Follow-up of Barrett's esophagus Providers:            Lollie Sails, MD Referring MD:         Ramonita Lab, MD (Referring MD) Medicines:            Monitored Anesthesia Care Complications:        No immediate complications. Procedure:            Pre-Anesthesia Assessment:                       - ASA Grade Assessment: III - A patient with severe                        systemic disease.                       After obtaining informed consent, the endoscope was                        passed under direct vision. Throughout the procedure,                        the patient's blood pressure, pulse, and oxygen                        saturations were monitored continuously. The Endoscope                        was introduced through the mouth, and advanced to the                        third part of duodenum. The upper GI endoscopy was                        accomplished without difficulty. The patient tolerated                        the procedure well. Findings:      There were esophageal mucosal changes secondary to established       long-segment Barrett's disease present in the lower third of the       esophagus. The maximum longitudinal extent of these mucosal changes was       9 cm in length. Mucosa was biopsied with a cold forceps for histology in       4 quadrants at intervals of 2 cm at 26, 27, 29, 31, 33 and 35 cm from       the incisors. A total of 6 specimen bottles were sent to pathology for       Barretts evaluation .      Diffuse minimal inflammation characterized by congestion (edema) and       erythema was found in the gastric body. Biopsies were  taken with a cold       forceps for histology.      A small hiatal hernia  was found. The Z-line was a variable distance from       incisors; the hiatal hernia was sliding.      The examined duodenum was normal.      The cardia and gastric fundus were normal on retroflexion otherwise. Impression:           - Esophageal mucosal changes secondary to established                        long-segment Barrett's disease. Biopsied.                       - Gastritis. Biopsied.                       - Small hiatal hernia.                       - Normal examined duodenum. Recommendation:       - Await pathology results.                       - Use Protonix (pantoprazole) 40 mg PO daily daily.                       - Use Zantac (ranitidine) 150 mg PO daily with the                        evening meal. Procedure Code(s):    --- Professional ---                       [phone], Esophagogastroduodenoscopy, flexible, transoral;                        with biopsy, single or multiple Diagnosis Code(s):    --- Professional ---                       K22.70, Barrett's esophagus without dysplasia                       K29.70, Gastritis, unspecified, without bleeding                       K44.9, Diaphragmatic hernia without obstruction or                        gangrene CPT copyright 2016 American Medical Association. All rights reserved. The codes documented in this report are preliminary and upon coder review may  be revised to meet current compliance requirements. Lollie Sails, MD 06/01/2017 3:06:27 PM This report has been signed electronically. Number of Addenda: 0 Note Initiated On: 06/01/2017 2:19 PM      [facility]

## 2017-06-01 NOTE — Anesthesia Postprocedure Evaluation (Signed)
Anesthesia Post Note  Patient: BARUCH LEWERS  Procedure(s) Performed: COLONOSCOPY WITH PROPOFOL (N/A ) ESOPHAGOGASTRODUODENOSCOPY (EGD) WITH PROPOFOL (N/A )  Patient location during evaluation: Endoscopy Anesthesia Type: General Level of consciousness: awake and alert and oriented Pain management: pain level controlled Vital Signs Assessment: post-procedure vital signs reviewed and stable Respiratory status: spontaneous breathing, nonlabored ventilation and respiratory function stable Cardiovascular status: blood pressure returned to baseline and stable Postop Assessment: no signs of nausea or vomiting Anesthetic complications: no     Last Vitals:  Vitals:   06/01/17 1246 06/01/17 1538  BP: (!) 166/97 96/67  Pulse: 64 61  Resp:  14  Temp: (!) 36 C (!) 36.1 C  SpO2: 98% 98%    Last Pain:  Vitals:   06/01/17 1558  TempSrc:   PainSc: 0-No pain                 Deyja Sochacki

## 2017-06-01 NOTE — Transfer of Care (Signed)
Immediate Anesthesia Transfer of Care Note  Patient: Derek Olsen  Procedure(s) Performed: COLONOSCOPY WITH PROPOFOL (N/A ) ESOPHAGOGASTRODUODENOSCOPY (EGD) WITH PROPOFOL (N/A )  Patient Location: PACU  Anesthesia Type:General  Level of Consciousness: awake, alert  and oriented  Airway & Oxygen Therapy: Patient Spontanous Breathing and Patient connected to nasal cannula oxygen  Post-op Assessment: Report given to RN and Post -op Vital signs reviewed and stable  Post vital signs: Reviewed and stable  Last Vitals:  Vitals:   06/01/17 1246 06/01/17 1538  BP: (!) 166/97 96/67  Pulse: 64 61  Resp:  14  Temp: (!) 36 C (!) 36.1 C  SpO2: 98% 98%    Last Pain:  Vitals:   06/01/17 1538  TempSrc: (P) Tympanic      Patients Stated Pain Goal: 0 (80/99/83 3825)  Complications: No apparent anesthesia complications

## 2017-06-01 NOTE — Anesthesia Post-op Follow-up Note (Signed)
Anesthesia QCDR form completed.        

## 2017-06-01 NOTE — Anesthesia Preprocedure Evaluation (Signed)
Anesthesia Evaluation  Patient identified by MRN, date of birth, ID band Patient awake    Reviewed: Allergy & Precautions, NPO status , Patient's Chart, lab work & pertinent test results, reviewed documented beta blocker date and time   Airway Mallampati: II  TM Distance: >3 FB     Dental  (+) Chipped   Pulmonary neg pulmonary ROS, sleep apnea , former smoker,    Pulmonary exam normal        Cardiovascular hypertension, Pt. on medications + CAD  Normal cardiovascular exam     Neuro/Psych  Headaches, negative psych ROS   GI/Hepatic Neg liver ROS, GERD  ,  Endo/Other  negative endocrine ROS  Renal/GU Renal InsufficiencyRenal disease     Musculoskeletal  (+) Arthritis , Osteoarthritis,    Abdominal Normal abdominal exam  (+)   Peds negative pediatric ROS (+)  Hematology negative hematology ROS (+)   Anesthesia Other Findings Past Medical History: No date: Allergic state No date: Arthritis No date: Barrett's esophagus No date: CAD (coronary artery disease) No date: Chickenpox No date: GERD (gastroesophageal reflux disease) No date: Hepatic cyst No date: Hypercholesterolemia No date: Hypertension No date: Hypertensive cardiovascular disease Left Shoulder: Joint disorder of shoulder No date: Migraines No date: Phlebitis No date: Phlebitis Right: Renal cyst No date: Renal cyst No date: Sleep apnea  Reproductive/Obstetrics                             Anesthesia Physical  Anesthesia Plan  ASA: III  Anesthesia Plan: General   Post-op Pain Management:    Induction: Intravenous  PONV Risk Score and Plan:   Airway Management Planned: Nasal Cannula  Additional Equipment:   Intra-op Plan:   Post-operative Plan:   Informed Consent: I have reviewed the patients History and Physical, chart, labs and discussed the procedure including the risks, benefits and alternatives for the  proposed anesthesia with the patient or authorized representative who has indicated his/her understanding and acceptance.     Plan Discussed with: CRNA  Anesthesia Plan Comments:         Anesthesia Quick Evaluation

## 2017-06-02 ENCOUNTER — Encounter: Payer: Self-pay | Admitting: Gastroenterology

## 2017-06-04 LAB — SURGICAL PATHOLOGY

## 2018-10-10 DIAGNOSIS — H5213 Myopia, bilateral: Secondary | ICD-10-CM | POA: Diagnosis not present

## 2019-02-15 DIAGNOSIS — D2271 Melanocytic nevi of right lower limb, including hip: Secondary | ICD-10-CM | POA: Diagnosis not present

## 2019-02-15 DIAGNOSIS — D225 Melanocytic nevi of trunk: Secondary | ICD-10-CM | POA: Diagnosis not present

## 2019-02-15 DIAGNOSIS — D2261 Melanocytic nevi of right upper limb, including shoulder: Secondary | ICD-10-CM | POA: Diagnosis not present

## 2019-02-15 DIAGNOSIS — D2262 Melanocytic nevi of left upper limb, including shoulder: Secondary | ICD-10-CM | POA: Diagnosis not present

## 2019-02-15 DIAGNOSIS — D2272 Melanocytic nevi of left lower limb, including hip: Secondary | ICD-10-CM | POA: Diagnosis not present

## 2019-02-15 DIAGNOSIS — X32XXXA Exposure to sunlight, initial encounter: Secondary | ICD-10-CM | POA: Diagnosis not present

## 2019-02-15 DIAGNOSIS — L570 Actinic keratosis: Secondary | ICD-10-CM | POA: Diagnosis not present

## 2019-03-14 DIAGNOSIS — E785 Hyperlipidemia, unspecified: Secondary | ICD-10-CM | POA: Diagnosis not present

## 2019-03-14 DIAGNOSIS — I10 Essential (primary) hypertension: Secondary | ICD-10-CM | POA: Diagnosis not present

## 2019-03-21 DIAGNOSIS — R002 Palpitations: Secondary | ICD-10-CM | POA: Diagnosis not present

## 2019-03-21 DIAGNOSIS — K219 Gastro-esophageal reflux disease without esophagitis: Secondary | ICD-10-CM | POA: Diagnosis not present

## 2019-03-21 DIAGNOSIS — Z23 Encounter for immunization: Secondary | ICD-10-CM | POA: Diagnosis not present

## 2019-03-21 DIAGNOSIS — I2510 Atherosclerotic heart disease of native coronary artery without angina pectoris: Secondary | ICD-10-CM | POA: Diagnosis not present

## 2019-03-21 DIAGNOSIS — G4733 Obstructive sleep apnea (adult) (pediatric): Secondary | ICD-10-CM | POA: Diagnosis not present

## 2019-03-21 DIAGNOSIS — I10 Essential (primary) hypertension: Secondary | ICD-10-CM | POA: Diagnosis not present

## 2019-11-07 ENCOUNTER — Other Ambulatory Visit
Admission: RE | Admit: 2019-11-07 | Discharge: 2019-11-07 | Disposition: A | Discharge status: Home / Self Care | Payer: 59 | Source: Ambulatory Visit | Attending: Internal Medicine | Admitting: Internal Medicine

## 2019-11-07 ENCOUNTER — Other Ambulatory Visit: Payer: Self-pay

## 2019-11-07 DIAGNOSIS — Z01812 Encounter for preprocedural laboratory examination: Secondary | ICD-10-CM | POA: Insufficient documentation

## 2019-11-07 DIAGNOSIS — Z20822 Contact with and (suspected) exposure to covid-19: Secondary | ICD-10-CM | POA: Insufficient documentation

## 2019-11-07 LAB — SARS CORONAVIRUS 2 (TAT 6-24 HRS): SARS Coronavirus 2: NEGATIVE

## 2019-11-09 ENCOUNTER — Encounter: Payer: Self-pay | Admitting: Internal Medicine

## 2019-11-09 ENCOUNTER — Ambulatory Visit: Payer: 59 | Admitting: Certified Registered Nurse Anesthetist

## 2019-11-09 ENCOUNTER — Encounter
Admission: RE | Disposition: A | Discharge status: Home / Self Care | Payer: Self-pay | Source: Home / Self Care | Attending: Internal Medicine

## 2019-11-09 ENCOUNTER — Other Ambulatory Visit: Payer: Self-pay

## 2019-11-09 ENCOUNTER — Ambulatory Visit
Admission: RE | Admit: 2019-11-09 | Discharge: 2019-11-09 | Disposition: A | Discharge status: Home / Self Care | Payer: 59 | Attending: Internal Medicine | Admitting: Internal Medicine

## 2019-11-09 DIAGNOSIS — Z955 Presence of coronary angioplasty implant and graft: Secondary | ICD-10-CM | POA: Insufficient documentation

## 2019-11-09 DIAGNOSIS — K2270 Barrett's esophagus without dysplasia: Secondary | ICD-10-CM | POA: Insufficient documentation

## 2019-11-09 DIAGNOSIS — K449 Diaphragmatic hernia without obstruction or gangrene: Secondary | ICD-10-CM | POA: Diagnosis not present

## 2019-11-09 DIAGNOSIS — Z79899 Other long term (current) drug therapy: Secondary | ICD-10-CM | POA: Diagnosis not present

## 2019-11-09 DIAGNOSIS — I10 Essential (primary) hypertension: Secondary | ICD-10-CM | POA: Insufficient documentation

## 2019-11-09 DIAGNOSIS — Z79890 Hormone replacement therapy (postmenopausal): Secondary | ICD-10-CM | POA: Diagnosis not present

## 2019-11-09 DIAGNOSIS — K2100 Gastro-esophageal reflux disease with esophagitis, without bleeding: Secondary | ICD-10-CM | POA: Diagnosis not present

## 2019-11-09 DIAGNOSIS — Z791 Long term (current) use of non-steroidal anti-inflammatories (NSAID): Secondary | ICD-10-CM | POA: Insufficient documentation

## 2019-11-09 DIAGNOSIS — Z09 Encounter for follow-up examination after completed treatment for conditions other than malignant neoplasm: Secondary | ICD-10-CM | POA: Insufficient documentation

## 2019-11-09 DIAGNOSIS — E7800 Pure hypercholesterolemia, unspecified: Secondary | ICD-10-CM | POA: Insufficient documentation

## 2019-11-09 DIAGNOSIS — I2510 Atherosclerotic heart disease of native coronary artery without angina pectoris: Secondary | ICD-10-CM | POA: Insufficient documentation

## 2019-11-09 DIAGNOSIS — Z7982 Long term (current) use of aspirin: Secondary | ICD-10-CM | POA: Insufficient documentation

## 2019-11-09 DIAGNOSIS — G4730 Sleep apnea, unspecified: Secondary | ICD-10-CM | POA: Diagnosis not present

## 2019-11-09 DIAGNOSIS — M1990 Unspecified osteoarthritis, unspecified site: Secondary | ICD-10-CM | POA: Insufficient documentation

## 2019-11-09 HISTORY — PX: ESOPHAGOGASTRODUODENOSCOPY: SHX5428

## 2019-11-09 SURGERY — EGD (ESOPHAGOGASTRODUODENOSCOPY)
Anesthesia: General

## 2019-11-09 MED ORDER — PROPOFOL 10 MG/ML IV BOLUS
INTRAVENOUS | Status: DC | PRN
  Administered 2019-11-09: 20 mg via INTRAVENOUS
  Administered 2019-11-09: 50 mg via INTRAVENOUS

## 2019-11-09 MED ORDER — LIDOCAINE HCL (CARDIAC) PF 100 MG/5ML IV SOSY
PREFILLED_SYRINGE | INTRAVENOUS | Status: DC | PRN
  Administered 2019-11-09: 50 mg via INTRAVENOUS

## 2019-11-09 MED ORDER — SODIUM CHLORIDE 0.9 % IV SOLN
INTRAVENOUS | Status: DC
  Administered 2019-11-09: 1000 mL via INTRAVENOUS

## 2019-11-09 MED ORDER — LIDOCAINE HCL (PF) 2 % IJ SOLN
INTRAMUSCULAR | Status: AC
  Filled 2019-11-09: qty 5

## 2019-11-09 MED ORDER — PROPOFOL 500 MG/50ML IV EMUL
INTRAVENOUS | Status: AC
  Filled 2019-11-09: qty 50

## 2019-11-09 MED ORDER — PROPOFOL 500 MG/50ML IV EMUL
INTRAVENOUS | Status: DC | PRN
  Administered 2019-11-09: 150 ug/kg/min via INTRAVENOUS

## 2019-11-09 NOTE — Op Note (Signed)
[facility] Patient Name: Zafir Schauer Procedure Date: 11/09/2019 9:09 AM MRN: [id number] Account #: [id number] Date of Birth: 03-24-1956 Admit Type: Outpatient Age: 64 Room: [facility] ENDO ROOM 1 Gender: Male Note Status: Finalized Procedure:             Upper GI endoscopy Indications:           Surveillance for malignancy due to personal history of                         Barrett's esophagus, Dysphagia, Gastro-esophageal                         reflux disease Providers:             Benay Pike. Alice Reichert MD, MD Referring MD:          Ramonita Lab, MD (Referring MD) Medicines:             Propofol per Anesthesia Complications:         No immediate complications. Estimated blood loss:                         Minimal. Procedure:             Pre-Anesthesia Assessment:                        - The risks and benefits of the procedure and the                         sedation options and risks were discussed with the                         patient. All questions were answered and informed                         consent was obtained.                        - Patient identification and proposed procedure were                         verified prior to the procedure by the nurse. The                         procedure was verified in the procedure room.                        - ASA Grade Assessment: III - A patient with severe                         systemic disease.                        - After reviewing the risks and benefits, the patient                         was deemed in satisfactory condition to undergo the                         procedure.  After obtaining informed consent, the endoscope was                         passed under direct vision. Throughout the procedure,                         the patient's blood pressure, pulse, and oxygen                         saturations were monitored continuously. The Endoscope                          was introduced through the mouth, and advanced to the                         third part of duodenum. The upper GI endoscopy was                         accomplished without difficulty. The patient tolerated                         the procedure well. The upper GI endoscopy was                         accomplished without difficulty. The patient tolerated                         the procedure well. Findings:      There were esophageal mucosal changes secondary to established       long-segment Barrett's disease present in the lower third of the       esophagus. The maximum longitudinal extent of these mucosal changes was       10 cm in length. Mucosa was biopsied with a cold forceps for histology       in 4 quadrants at intervals of 2 cm from 25 to 33 cm from the incisors.       A total of 5 specimen bottles were sent to pathology.      Normal mucosa was found in the middle third of the esophagus. Biopsies       were taken with a cold forceps for histology. Bottle labeled "23cm "      There is no endoscopic evidence of stenosis, stricture, ulcerations or       mass in the entire esophagus.      A large hiatal hernia was present.      The examined duodenum was normal.      The exam was otherwise without abnormality. Impression:            - Esophageal mucosal changes secondary to established                         long-segment Barrett's disease. Biopsied.                        - Normal mucosa was found in the middle third of the                         esophagus. Biopsied.                        -  Large hiatal hernia.                        - Normal examined duodenum.                        - The examination was otherwise normal. Recommendation:        - Patient has a contact number available for                         emergencies. The signs and symptoms of potential                         delayed complications were discussed with the patient.                         Return  to normal activities tomorrow. Written                         discharge instructions were provided to the patient.                        - Resume previous diet.                        - Continue present medications.                        - Await pathology results.                        - Repeat upper endoscopy after studies are complete                         for surveillance.                        - Perform a barium swallow using barium in liquid and                         tablet form at appointment to be scheduled.                        - The findings and recommendations were discussed with                         the patient.                        - Return to physician assistant in 3 months.                        - The findings and recommendations were discussed with                         the patient. Procedure Code(s):     --- Professional ---                        [phone], Esophagogastroduodenoscopy, flexible,  transoral; with biopsy, single or multiple Diagnosis Code(s):     --- Professional ---                        K21.9, Gastro-esophageal reflux disease without                         esophagitis                        R13.10, Dysphagia, unspecified                        K44.9, Diaphragmatic hernia without obstruction or                         gangrene                        K22.70, Barrett's esophagus without dysplasia CPT copyright 2019 American Medical Association. All rights reserved. The codes documented in this report are preliminary and upon coder review may  be revised to meet current compliance requirements. Efrain Sella MD, MD 11/09/2019 9:33:29 AM This report has been signed electronically. Number of Addenda: 0 Note Initiated On: 11/09/2019 9:09 AM Estimated Blood Loss:  Estimated blood loss was minimal.      [facility]

## 2019-11-09 NOTE — Anesthesia Postprocedure Evaluation (Signed)
Anesthesia Post Note  Patient: Derek Olsen  Procedure(s) Performed: ESOPHAGOGASTRODUODENOSCOPY (EGD) (N/A )  Patient location during evaluation: Endoscopy Anesthesia Type: General Level of consciousness: awake and alert Pain management: pain level controlled Vital Signs Assessment: post-procedure vital signs reviewed and stable Respiratory status: spontaneous breathing and respiratory function stable Cardiovascular status: stable Anesthetic complications: no   No complications documented.   Last Vitals:  Vitals:   11/09/19 0931 11/09/19 0941  BP: 100/66 111/66  Pulse: (!) 53 (!) 51  Resp: 12 13  Temp: (!) 36.1 C   SpO2: 95% 92%    Last Pain:  Vitals:   11/09/19 0951  TempSrc:   PainSc: 0-No pain                 Brielynn Sekula K

## 2019-11-09 NOTE — Interval H&P Note (Signed)
History and Physical Interval Note:  11/09/2019 9:01 AM  Derek Olsen  has presented today for surgery, with the diagnosis of BARRETT'S,GERD,DYSPHAGIA.  The various methods of treatment have been discussed with the patient and family. After consideration of risks, benefits and other options for treatment, the patient has consented to  Procedure(s): ESOPHAGOGASTRODUODENOSCOPY (EGD) (N/A) as a surgical intervention.  The patient's history has been reviewed, patient examined, no change in status, stable for surgery.  I have reviewed the patient's chart and labs.  Questions were answered to the patient's satisfaction.     [place], [place]

## 2019-11-09 NOTE — Anesthesia Preprocedure Evaluation (Addendum)
Anesthesia Evaluation  Patient identified by MRN, date of birth, ID band Patient awake    Reviewed: Allergy & Precautions, NPO status , Patient's Chart, lab work & pertinent test results  History of Anesthesia Complications Negative for: history of anesthetic complications  Airway Mallampati: II       Dental   Pulmonary sleep apnea (not using CPAP) , neg COPD, Not current smoker, former smoker,           Cardiovascular hypertension, Pt. on medications + CAD and + Cardiac Stents  (-) CHF (-) dysrhythmias (-) Valvular Problems/Murmurs     Neuro/Psych neg Seizures    GI/Hepatic Neg liver ROS, GERD  Medicated and Controlled,  Endo/Other  neg diabetes  Renal/GU Renal diseaseRenal cyst     Musculoskeletal   Abdominal   Peds  Hematology   Anesthesia Other Findings   Reproductive/Obstetrics                            Anesthesia Physical Anesthesia Plan  ASA: III  Anesthesia Plan: General   Post-op Pain Management:    Induction: Intravenous  PONV Risk Score and Plan: 2 and Propofol infusion and TIVA  Airway Management Planned: Nasal Cannula  Additional Equipment:   Intra-op Plan:   Post-operative Plan:   Informed Consent: I have reviewed the patients History and Physical, chart, labs and discussed the procedure including the risks, benefits and alternatives for the proposed anesthesia with the patient or authorized representative who has indicated his/her understanding and acceptance.       Plan Discussed with:   Anesthesia Plan Comments:         Anesthesia Quick Evaluation

## 2019-11-09 NOTE — H&P (Signed)
Outpatient short stay form Pre-procedure 11/09/2019 8:59 AM Derek Olsen K. Alice Reichert, M.D.  Primary Physician: Ramonita Lab III, M.D.  Reason for visit:  Barrett's esophagus, GERD, Dysphagia  History of present illness:  Pleasant 64 y/o male presents with GERD, controlled on BID PPI therapy. He has a diagnosis of "long segment" Barrett's esophagus for > 10 years and has regular surveillance, last being in 05/2017 showing Barrett's without dysplasia.  Over the past 6 months, he has experience dysphagia to solids at the level of the mid-sternum without attendant vomiting, abdominal pain, weight loss, or melena.     Current Facility-Administered Medications:  .  0.9 %  sodium chloride infusion, , Intravenous, Continuous, [place], Benay Pike, MD, Last Rate: 20 mL/hr at 11/09/19 0842, 1,000 mL at 11/09/19 0842  Medications Prior to Admission  Medication Sig Dispense Refill Last Dose  . aspirin 81 MG tablet Take 81 mg by mouth daily.   Past Week at Unknown time  . levothyroxine (SYNTHROID, LEVOTHROID) 25 MCG tablet Take 25 mcg by mouth daily before breakfast.   11/08/2019 at Unknown time  . Multiple Vitamin (MULTIVITAMIN) tablet Take 1 tablet by mouth daily.   Past Week at Unknown time  . pantoprazole (PROTONIX) 40 MG tablet Take 40 mg by mouth daily.   11/08/2019 at Unknown time  . valsartan-hydrochlorothiazide (DIOVAN-HCT) 160-25 MG tablet Take 1 tablet by mouth 2 (two) times daily.   11/09/2019 at Unknown time  . acetaminophen (TYLENOL) 500 MG tablet Take 500 mg by mouth every 4 (four) hours as needed.     . clopidogrel (PLAVIX) 75 MG tablet Take 75 mg by mouth daily. (Patient not taking: Reported on 11/09/2019)   Not Taking at Unknown time  . fluocinonide (LIDEX) 0.05 % external solution Apply 1 application topically 2 (two) times daily.     . Ibuprofen (MOTRIN PO) Take by mouth at bedtime as needed.     . Menthol, Topical Analgesic, (ICY HOT EX) Apply topically.     . naproxen sodium (ANAPROX) 220 MG  tablet Take 220 mg by mouth 2 (two) times daily with a meal.     . omega-3 acid ethyl esters (LOVAZA) 1 g capsule Take by mouth 2 (two) times daily.     Marland Kitchen trolamine salicylate (ASPERCREME) 10 % cream Apply 1 application topically as needed for muscle pain.        Allergies  Allergen Reactions  . Crestor [Rosuvastatin Calcium]   . Demerol [Meperidine]   . Lipitor [Atorvastatin]   . Lopid [Gemfibrozil]   . Morphine And Related      Past Medical History:  Diagnosis Date  . Allergic state   . Arthritis   . Barrett's esophagus   . CAD (coronary artery disease)   . Chickenpox   . GERD (gastroesophageal reflux disease)   . Hepatic cyst   . Hypercholesterolemia   . Hypertension   . Hypertensive cardiovascular disease   . Joint disorder of shoulder Left Shoulder  . Migraines   . Phlebitis   . Phlebitis   . Renal cyst Right  . Renal cyst   . Sleep apnea     Review of systems:  Otherwise negative.    Physical Exam  Gen: Alert, oriented. Appears stated age.  HEENT: [place]/AT. PERRLA. Lungs: CTA, no wheezes. CV: RR nl S1, S2. Abd: soft, benign, no masses. BS+ Ext: No edema. Pulses 2+    Planned procedures: Proceed with EGD with biopsies and possible esophageal dilation. The patient understands the nature of  the planned procedure, indications, risks, alternatives and potential complications including but not limited to bleeding, infection, perforation, damage to internal organs and possible oversedation/side effects from anesthesia. The patient agrees and gives consent to proceed.  Please refer to procedure notes for findings, recommendations and patient disposition/instructions.     Derek Olsen K. Alice Reichert, M.D. Gastroenterology 11/09/2019  8:59 AM

## 2019-11-09 NOTE — Transfer of Care (Signed)
Immediate Anesthesia Transfer of Care Note  Patient: Derek Olsen  Procedure(s) Performed: ESOPHAGOGASTRODUODENOSCOPY (EGD) (N/A )  Patient Location: Endoscopy Unit  Anesthesia Type:General  Level of Consciousness: drowsy  Airway & Oxygen Therapy: Patient Spontanous Breathing  Post-op Assessment: Report given to RN and Post -op Vital signs reviewed and stable  Post vital signs: Reviewed and stable  Last Vitals:  Vitals Value Taken Time  BP 100/66 11/09/19 0932  Temp 36.1 C 11/09/19 0931  Pulse 54 11/09/19 0933  Resp 13 11/09/19 0933  SpO2 93 % 11/09/19 0933  Vitals shown include unvalidated device data.  Last Pain:  Vitals:   11/09/19 0931  TempSrc: Temporal  PainSc:          Complications: No complications documented.

## 2019-11-10 ENCOUNTER — Encounter: Payer: Self-pay | Admitting: Internal Medicine

## 2019-11-10 LAB — SURGICAL PATHOLOGY

## 2019-11-29 ENCOUNTER — Other Ambulatory Visit: Payer: Self-pay | Admitting: Critical Care Medicine

## 2019-11-29 ENCOUNTER — Other Ambulatory Visit: Payer: 59

## 2019-11-29 DIAGNOSIS — Z20822 Contact with and (suspected) exposure to covid-19: Secondary | ICD-10-CM

## 2019-11-30 LAB — NOVEL CORONAVIRUS, NAA: SARS-CoV-2, NAA: NOT DETECTED

## 2019-12-01 ENCOUNTER — Other Ambulatory Visit: Payer: Self-pay | Admitting: Gastroenterology

## 2019-12-01 DIAGNOSIS — K449 Diaphragmatic hernia without obstruction or gangrene: Secondary | ICD-10-CM

## 2020-02-16 ENCOUNTER — Ambulatory Visit
Admission: RE | Admit: 2020-02-16 | Discharge: 2020-02-16 | Disposition: A | Discharge status: Home / Self Care | Payer: 59 | Source: Ambulatory Visit | Attending: Gastroenterology | Admitting: Gastroenterology

## 2020-02-16 ENCOUNTER — Other Ambulatory Visit: Payer: Self-pay

## 2020-02-16 DIAGNOSIS — K449 Diaphragmatic hernia without obstruction or gangrene: Secondary | ICD-10-CM | POA: Diagnosis present

## 2020-10-28 HISTORY — PX: NM GATED MYOVIEW (ARMC HX): HXRAD1856

## 2020-12-13 ENCOUNTER — Emergency Department
Admission: EM | Admit: 2020-12-13 | Discharge: 2020-12-13 | Disposition: A | Discharge status: Home / Self Care | Payer: 59 | Attending: Emergency Medicine | Admitting: Emergency Medicine

## 2020-12-13 ENCOUNTER — Encounter: Payer: Self-pay | Admitting: Emergency Medicine

## 2020-12-13 ENCOUNTER — Other Ambulatory Visit: Payer: Self-pay

## 2020-12-13 DIAGNOSIS — T63481A Toxic effect of venom of other arthropod, accidental (unintentional), initial encounter: Secondary | ICD-10-CM | POA: Insufficient documentation

## 2020-12-13 DIAGNOSIS — Z79899 Other long term (current) drug therapy: Secondary | ICD-10-CM | POA: Diagnosis not present

## 2020-12-13 DIAGNOSIS — Z7982 Long term (current) use of aspirin: Secondary | ICD-10-CM | POA: Diagnosis not present

## 2020-12-13 DIAGNOSIS — I2510 Atherosclerotic heart disease of native coronary artery without angina pectoris: Secondary | ICD-10-CM | POA: Insufficient documentation

## 2020-12-13 DIAGNOSIS — Z87891 Personal history of nicotine dependence: Secondary | ICD-10-CM | POA: Insufficient documentation

## 2020-12-13 DIAGNOSIS — R42 Dizziness and giddiness: Secondary | ICD-10-CM | POA: Diagnosis not present

## 2020-12-13 DIAGNOSIS — R202 Paresthesia of skin: Secondary | ICD-10-CM | POA: Diagnosis not present

## 2020-12-13 DIAGNOSIS — I10 Essential (primary) hypertension: Secondary | ICD-10-CM | POA: Diagnosis not present

## 2020-12-13 MED ORDER — EPINEPHRINE 0.3 MG/0.3ML IJ SOAJ: 0.3000 mg | INTRAMUSCULAR | 1 refills | Status: AC | PRN

## 2020-12-13 NOTE — ED Provider Notes (Signed)
[facility] Emergency Department Provider Note  ____________________________________________   Event Date/Time   First MD Initiated Contact with Patient 12/13/20 1330     (approximate)  I have reviewed the triage vital signs and the nursing notes.   HISTORY  Chief Complaint Allergic Reaction    HPI Derek Olsen is a 65 y.o. male patient arrived via EMS secondary to being stung by yellow jacket.  Patient states he started feeling tingling in his lips and felt dizzy.  Patient states similar episode couple years ago.  EMS state patient blood pressure upon arrival was 80/50 and given 500 mL of normal saline and 50 mg a Benadryl.  Patient states feeling better after arrival to the ED.  States little sleepy from the Benadryl.  Denies any difficulty breathing.  Denies any tongue swelling.         Past Medical History:  Diagnosis Date   Allergic state    Arthritis    Barrett's esophagus    CAD (coronary artery disease)    Chickenpox    GERD (gastroesophageal reflux disease)    Hepatic cyst    Hypercholesterolemia    Hypertension    Hypertensive cardiovascular disease    Joint disorder of shoulder Left Shoulder   Migraines    Phlebitis    Phlebitis    Renal cyst Right   Renal cyst    Sleep apnea     There are no problems to display for this patient.   Past Surgical History:  Procedure Laterality Date   APPENDECTOMY     CARDIAC CATHETERIZATION     CHOLECYSTECTOMY     COLONOSCOPY     COLONOSCOPY WITH PROPOFOL N/A 06/01/2017   Procedure: COLONOSCOPY WITH PROPOFOL;  Surgeon: Lollie Sails, MD;  Location: [facility] ENDOSCOPY;  Service: Endoscopy;  Laterality: N/A;   CORONARY ANGIOPLASTY WITH STENT PLACEMENT     CORONARY ANGIOPLASTY WITH STENT PLACEMENT     ESOPHAGOGASTRODUODENOSCOPY     ESOPHAGOGASTRODUODENOSCOPY N/A 11/09/2019   Procedure: ESOPHAGOGASTRODUODENOSCOPY (EGD);  Surgeon: Toledo, Benay Pike, MD;  Location: [facility] ENDOSCOPY;  Service:  Gastroenterology;  Laterality: N/A;   ESOPHAGOGASTRODUODENOSCOPY (EGD) WITH PROPOFOL N/A 03/05/2015   Procedure: ESOPHAGOGASTRODUODENOSCOPY (EGD) WITH PROPOFOL;  Surgeon: Lollie Sails, MD;  Location: [facility] ENDOSCOPY;  Service: Endoscopy;  Laterality: N/A;   ESOPHAGOGASTRODUODENOSCOPY (EGD) WITH PROPOFOL N/A 06/01/2017   Procedure: ESOPHAGOGASTRODUODENOSCOPY (EGD) WITH PROPOFOL;  Surgeon: Lollie Sails, MD;  Location: [facility] ENDOSCOPY;  Service: Endoscopy;  Laterality: N/A;    Prior to Admission medications   Medication Sig Start Date End Date Taking? Authorizing Provider  EPINEPHrine (EPIPEN 2-PAK) 0.3 mg/0.3 mL IJ SOAJ injection Inject 0.3 mg into the muscle as needed for anaphylaxis. 12/13/20  Yes Sable Feil, PA-C  acetaminophen (TYLENOL) 500 MG tablet Take 500 mg by mouth every 4 (four) hours as needed.    [provider]  aspirin 81 MG tablet Take 81 mg by mouth daily.    [provider]  clopidogrel (PLAVIX) 75 MG tablet Take 75 mg by mouth daily. Patient not taking: Reported on 11/09/2019    [provider]  fluocinonide (LIDEX) 0.05 % external solution Apply 1 application topically 2 (two) times daily.    [provider]  Ibuprofen (MOTRIN PO) Take by mouth at bedtime as needed.    [provider]  levothyroxine (SYNTHROID, LEVOTHROID) 25 MCG tablet Take 25 mcg by mouth daily before breakfast.    [provider]  Menthol, Topical Analgesic, ([place]  EX) Apply topically.    [provider]  Multiple Vitamin (MULTIVITAMIN) tablet Take 1 tablet by mouth daily.    [provider]  naproxen sodium (ANAPROX) 220 MG tablet Take 220 mg by mouth 2 (two) times daily with a meal.    [provider]  omega-3 acid ethyl esters (LOVAZA) 1 g capsule Take by mouth 2 (two) times daily.    [provider]  pantoprazole (PROTONIX) 40 MG tablet Take 40 mg by mouth daily.    [provider]  trolamine  salicylate (ASPERCREME) 10 % cream Apply 1 application topically as needed for muscle pain.    [provider]  valsartan-hydrochlorothiazide (DIOVAN-HCT) 160-25 MG tablet Take 1 tablet by mouth 2 (two) times daily.    [provider]    Allergies Crestor [rosuvastatin calcium], Demerol [meperidine], Lipitor [atorvastatin], Lopid [gemfibrozil], and Morphine and related  No family history on file.  Social History Social History   Tobacco Use   Smoking status: Former    Packs/day: 0.50    Years: 0.50    Pack years: 0.25    Types: Cigarettes   Smokeless tobacco: Never  Substance Use Topics   Alcohol use: No   Drug use: No    Review of Systems Constitutional: No fever/chills Eyes: No visual changes. ENT: No sore throat. Cardiovascular: Denies chest pain. Respiratory: Denies shortness of breath. Gastrointestinal: No abdominal pain.  No nausea, no vomiting.  No diarrhea.  No constipation. Genitourinary: Negative for dysuria. Musculoskeletal: Negative for back pain. Skin: Negative for rash.  Oral lip edema . endocrine: Hyperlipidemia and hypertension. Allergic/Immunilogical: Crestor, Demerol, Lipitor, Lopid, and morphine. ____________________________________________   PHYSICAL EXAM:  VITAL SIGNS: ED Triage Vitals  Enc Vitals Group     BP 12/13/20 1301 134/82     Pulse Rate 12/13/20 1301 79     Resp 12/13/20 1301 20     Temp 12/13/20 1301 98.1 F (36.7 C)     Temp Source 12/13/20 1301 Oral     SpO2 12/13/20 1301 96 %     Weight 12/13/20 1304 183 lb (83 kg)     Height 12/13/20 1304 '5\' 6"'$  (1.676 m)     Head Circumference --      Peak Flow --      Pain Score 12/13/20 1304 0     Pain Loc --      Pain Edu? --      Excl. in [place]? --     Constitutional: Alert and oriented. Well appearing and in no acute distress. Eyes: Conjunctivae are normal. PERRL. EOMI. Head: Atraumatic. Nose: No congestion/rhinnorhea. Mouth/Throat: Mucous membranes are moist.   Oropharynx non-erythematous. Neck: No stridor.   Hematological/Lymphatic/Immunilogical: No cervical lymphadenopathy. Cardiovascular: Normal rate, regular rhythm. Grossly normal heart sounds.  Good peripheral circulation. Respiratory: Normal respiratory effort.  No retractions. Lungs CTAB. Gastrointestinal: Soft and nontender. No distention. No abdominal bruits. No CVA tenderness. Genitourinary: Deferred Musculoskeletal: No lower extremity tenderness nor edema.  No joint effusions. Neurologic:  Normal speech and language. No gross focal neurologic deficits are appreciated. No gait instability. Skin:  Skin is warm, dry and intact. No rash noted. Psychiatric: Mood and affect are normal. Speech and behavior are normal.  ____________________________________________   LABS (all labs ordered are listed, but only abnormal results are displayed)  Labs Reviewed - No data to display ____________________________________________  EKG   ____________________________________________  RADIOLOGY I, Sable Feil, personally viewed and evaluated these images (plain radiographs) as part of my medical  decision making, as well as reviewing the written report by the radiologist.  ED MD interpretation:    Official radiology report(s): No results found.  ____________________________________________   PROCEDURES  Procedure(s) performed (including Critical Care):  Procedures   ____________________________________________   INITIAL IMPRESSION / ASSESSMENT AND PLAN / ED COURSE  As part of my medical decision making, I reviewed the following data within the [place]         Patient presents allergic reaction to insect sting prior to arrival.  In route patient received 500 mL normal saline and 50 mg of Benadryl.  Patient states complaint is completely resolved.  Does not patient rationale for considering current EpiPen since this is a second occurrence with similar  complaint.  Patient given discharge care instruction advised follow-up PCP.      ____________________________________________   FINAL CLINICAL IMPRESSION(S) / ED DIAGNOSES  Final diagnoses:  Insect stings, accidental or unintentional, initial encounter     ED Discharge Orders          Ordered    EPINEPHrine (EPIPEN 2-PAK) 0.3 mg/0.3 mL IJ SOAJ injection  As needed        12/13/20 1414             Note:  This document was prepared using Dragon voice recognition software and may include unintentional dictation errors.    Sable Feil, PA-C 12/13/20 1425    Lavonia Drafts, MD 12/13/20 1450

## 2020-12-13 NOTE — ED Triage Notes (Signed)
Pt was outside and bit by a bug possible a yellow jacket. His lips started tingling and felt dizzy he had this happen before that was similar with symptoms. When EMS arrived he his B/P was 80/50 and they gave him 545m of NS and '50mg'$  of Benadryl. He is now able to speak in complete sentences. He is tired from the Benadryl. Denies any trouble breathing.

## 2020-12-13 NOTE — Discharge Instructions (Addendum)
Read and follow discharge care instructions.  Advised over-the-counter Benadryl 25 mg every 6 to 8 hours as needed.  Keep EpiPen on hand when working outdoors.

## 2021-01-28 ENCOUNTER — Other Ambulatory Visit: Payer: Self-pay

## 2021-01-28 ENCOUNTER — Ambulatory Visit: Payer: 59 | Attending: Internal Medicine

## 2021-01-28 DIAGNOSIS — Z23 Encounter for immunization: Secondary | ICD-10-CM

## 2021-01-28 MED ORDER — PFIZER COVID-19 VAC BIVALENT 30 MCG/0.3ML IM SUSP
INTRAMUSCULAR | 0 refills | Status: DC
  Filled 2021-01-28: qty 0.3, 1d supply, fill #0

## 2021-01-28 NOTE — Progress Notes (Signed)
   Covid-19 Vaccination Clinic  Name:  Derek Olsen    MRN: [id number] DOB: July 08, 1955  01/28/2021  [name] was observed post Covid-19 immunization for 30 minutes based on pre-vaccination screening without incident. He was provided with Vaccine Information Sheet and instruction to access the V-Safe system.   [name] was instructed to call 911 with any severe reactions post vaccine: Difficulty breathing  Swelling of face and throat  A fast heartbeat  A bad rash all over body  Dizziness and weakness   Immunizations Administered     Name Date Dose VIS Date Route   Pfizer Covid-19 Vaccine Bivalent Booster 01/28/2021  2:24 PM 0.3 mL 11/27/2020 Intramuscular   Manufacturer: [place]   Lot: [place]: [place], PharmD, MBA Clinical Acute Care Pharmacist

## 2021-09-17 DIAGNOSIS — I493 Ventricular premature depolarization: Secondary | ICD-10-CM | POA: Diagnosis not present

## 2021-09-17 DIAGNOSIS — I10 Essential (primary) hypertension: Secondary | ICD-10-CM | POA: Diagnosis not present

## 2021-09-17 DIAGNOSIS — I2510 Atherosclerotic heart disease of native coronary artery without angina pectoris: Secondary | ICD-10-CM | POA: Diagnosis not present

## 2021-09-17 DIAGNOSIS — G4733 Obstructive sleep apnea (adult) (pediatric): Secondary | ICD-10-CM | POA: Diagnosis not present

## 2021-09-18 DIAGNOSIS — G4733 Obstructive sleep apnea (adult) (pediatric): Secondary | ICD-10-CM | POA: Diagnosis not present

## 2021-09-18 DIAGNOSIS — Z125 Encounter for screening for malignant neoplasm of prostate: Secondary | ICD-10-CM | POA: Diagnosis not present

## 2021-09-18 DIAGNOSIS — E785 Hyperlipidemia, unspecified: Secondary | ICD-10-CM | POA: Diagnosis not present

## 2021-09-18 DIAGNOSIS — I10 Essential (primary) hypertension: Secondary | ICD-10-CM | POA: Diagnosis not present

## 2021-09-25 DIAGNOSIS — M19012 Primary osteoarthritis, left shoulder: Secondary | ICD-10-CM | POA: Diagnosis not present

## 2021-09-25 DIAGNOSIS — T466X5A Adverse effect of antihyperlipidemic and antiarteriosclerotic drugs, initial encounter: Secondary | ICD-10-CM | POA: Diagnosis not present

## 2021-09-25 DIAGNOSIS — I10 Essential (primary) hypertension: Secondary | ICD-10-CM | POA: Diagnosis not present

## 2021-09-25 DIAGNOSIS — E038 Other specified hypothyroidism: Secondary | ICD-10-CM | POA: Diagnosis not present

## 2021-09-25 DIAGNOSIS — G4733 Obstructive sleep apnea (adult) (pediatric): Secondary | ICD-10-CM | POA: Diagnosis not present

## 2021-09-25 DIAGNOSIS — I2510 Atherosclerotic heart disease of native coronary artery without angina pectoris: Secondary | ICD-10-CM | POA: Diagnosis not present

## 2021-09-25 DIAGNOSIS — I493 Ventricular premature depolarization: Secondary | ICD-10-CM | POA: Diagnosis not present

## 2021-09-25 DIAGNOSIS — K219 Gastro-esophageal reflux disease without esophagitis: Secondary | ICD-10-CM | POA: Diagnosis not present

## 2021-09-25 DIAGNOSIS — E785 Hyperlipidemia, unspecified: Secondary | ICD-10-CM | POA: Diagnosis not present

## 2021-09-25 DIAGNOSIS — Z0000 Encounter for general adult medical examination without abnormal findings: Secondary | ICD-10-CM | POA: Diagnosis not present

## 2021-09-25 DIAGNOSIS — G720 Drug-induced myopathy: Secondary | ICD-10-CM | POA: Diagnosis not present

## 2021-10-16 DIAGNOSIS — E038 Other specified hypothyroidism: Secondary | ICD-10-CM | POA: Diagnosis not present

## 2021-10-20 ENCOUNTER — Emergency Department: Payer: PPO

## 2021-10-20 ENCOUNTER — Emergency Department
Admission: EM | Admit: 2021-10-20 | Discharge: 2021-10-20 | Disposition: A | Discharge status: Home / Self Care | Payer: PPO | Attending: Student in an Organized Health Care Education/Training Program | Admitting: Student in an Organized Health Care Education/Training Program

## 2021-10-20 ENCOUNTER — Other Ambulatory Visit: Payer: Self-pay

## 2021-10-20 DIAGNOSIS — R079 Chest pain, unspecified: Secondary | ICD-10-CM | POA: Diagnosis not present

## 2021-10-20 DIAGNOSIS — I2510 Atherosclerotic heart disease of native coronary artery without angina pectoris: Secondary | ICD-10-CM | POA: Insufficient documentation

## 2021-10-20 DIAGNOSIS — R0789 Other chest pain: Secondary | ICD-10-CM | POA: Diagnosis not present

## 2021-10-20 DIAGNOSIS — K449 Diaphragmatic hernia without obstruction or gangrene: Secondary | ICD-10-CM | POA: Diagnosis not present

## 2021-10-20 LAB — BASIC METABOLIC PANEL
Anion gap: 8 (ref 5–15)
BUN: 13 mg/dL (ref 8–23)
CO2: 26 mmol/L (ref 22–32)
Calcium: 9.6 mg/dL (ref 8.9–10.3)
Chloride: 106 mmol/L (ref 98–111)
Creatinine, Ser: 0.85 mg/dL (ref 0.61–1.24)
GFR, Estimated: >60 mL/min (ref >60)
Glucose, Bld: 123 mg/dL — ABNORMAL HIGH (ref 70–99)
Potassium: 3.2 mmol/L — ABNORMAL LOW (ref 3.5–5.1)
Sodium: 140 mmol/L (ref 135–145)

## 2021-10-20 LAB — CBC
HCT: 46.2 % (ref 39.0–52.0)
Hemoglobin: 16.1 g/dL (ref 13.0–17.0)
MCH: 29.7 pg (ref 26.0–34.0)
MCHC: 34.8 g/dL (ref 30.0–36.0)
MCV: 85.1 fL (ref 80.0–100.0)
Platelets: 224 10*3/uL (ref 150–400)
RBC: 5.43 MIL/uL (ref 4.22–5.81)
RDW: 13.5 % (ref 11.5–15.5)
WBC: 6.6 10*3/uL (ref 4.0–10.5)
nRBC: 0 % (ref 0.0–0.2)

## 2021-10-20 LAB — TROPONIN I (HIGH SENSITIVITY)
Troponin I (High Sensitivity): 5 ng/L (ref <18)
Troponin I (High Sensitivity): 5 ng/L (ref <18)

## 2021-10-20 LAB — D-DIMER, QUANTITATIVE: D-Dimer, Quant: 0.9 ug/mL-FEU — ABNORMAL HIGH (ref 0.00–0.50)

## 2021-10-20 MED ORDER — AMLODIPINE BESYLATE 5 MG PO TABS
5.0000 mg | ORAL_TABLET | Freq: Once | ORAL | Status: AC
  Administered 2021-10-20: 5 mg via ORAL
  Filled 2021-10-20: qty 1

## 2021-10-20 MED ORDER — IOHEXOL 350 MG/ML SOLN
75.0000 mL | Freq: Once | INTRAVENOUS | Status: AC | PRN
  Administered 2021-10-20: 75 mL via INTRAVENOUS

## 2021-10-20 NOTE — ED Notes (Addendum)
See triage note. Pt has stent, denies hx MI, sees cardiologist. This morning had L chest pain radiating to L arm and shoulder. States pain is much better. Pt in NAD. VS obtained and second trop drawn.  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

## 2021-10-20 NOTE — ED Provider Triage Note (Signed)
Emergency Medicine Provider Triage Evaluation Note  Derek Olsen , a 66 y.o. male  was evaluated in triage.  Pt complains of left-sided chest pain that radiates to his back starting yesterday at church.  Patient also has some shortness of breath.  He reports that he used a push mower on Saturday but did not have any problems at that time.  Patient has a history of coronary artery disease and has 1 stent, Barrett's esophagitis, hypertension.  Review of Systems  Positive: Positive chest pain, shortness of breath Negative: Negative diaphoresis, nausea or vomiting.  No previous MI.  Physical Exam  Ht '5\' 6"'$  (1.676 m)   Wt 81.6 kg   BMI 29.05 kg/m  Gen:   Awake, no distress talks in complete sentences denies shortness of breath noted. Resp:  Normal effort lungs are clear bilaterally. MSK:   Moves extremities without difficulty  Other:    Medical Decision Making  Medically screening exam initiated at 10:43 AM.  Appropriate orders placed.  Philis Fendt was informed that the remainder of the evaluation will be completed by another provider, this initial triage assessment does not replace that evaluation, and the importance of remaining in the ED until their evaluation is complete.     [name], PA-C 10/20/21 1046

## 2021-10-20 NOTE — ED Provider Notes (Signed)
[facility] Provider Note    Event Date/Time   First MD Initiated Contact with Patient 10/20/21 1229     (approximate)   History   Chest Pain   HPI  Derek Olsen is a 66 y.o. male  who presents to the ER for evaluation of left sided chest pain, worsening with deep inspiration.  Checked his BP and it was elevated.  No fevers or chills. No n/v/d.  H/o CAD with stent.  Follows with [facility] clinic.  No diaphoresis or exertional worsening of symptoms.         Physical Exam   Triage Vital Signs: ED Triage Vitals  Enc Vitals Group     BP 10/20/21 1045 (!) 142/100     Pulse Rate 10/20/21 1045 82     Resp 10/20/21 1045 18     Temp 10/20/21 1045 98.5 F (36.9 C)     Temp Source 10/20/21 1045 Oral     SpO2 10/20/21 1045 100 %     Weight 10/20/21 1038 180 lb (81.6 kg)     Height 10/20/21 1038 '5\' 6"'$  (1.676 m)     Head Circumference --      Peak Flow --      Pain Score 10/20/21 1037 3     Pain Loc --      Pain Edu? --      Excl. in [place]? --     Most recent vital signs: Vitals:   10/20/21 1235 10/20/21 1526  BP: (!) 141/97 (!) 140/98  Pulse: 71 70  Resp: 16 16  Temp:  98.5 F (36.9 C)  SpO2: 97% 98%     Constitutional: Alert  Eyes: Conjunctivae are normal.  Head: Atraumatic. Nose: No congestion/rhinnorhea. Mouth/Throat: Mucous membranes are moist.   Neck: Painless ROM.  Cardiovascular:   Good peripheral circulation. Respiratory: Normal respiratory effort.  No retractions.  Gastrointestinal: Soft and nontender.  Musculoskeletal:  no deformity Neurologic:  MAE spontaneously. No gross focal neurologic deficits are appreciated.  Skin:  Skin is warm, dry and intact. No rash noted. Psychiatric: Mood and affect are normal. Speech and behavior are normal.    ED Results / Procedures / Treatments   Labs (all labs ordered are listed, but only abnormal results are displayed) Labs Reviewed  BASIC METABOLIC PANEL - Abnormal; Notable for the  following components:      Result Value   Potassium 3.2 (*)    Glucose, Bld 123 (*)    All other components within normal limits  D-DIMER, QUANTITATIVE - Abnormal; Notable for the following components:   D-Dimer, Quant 0.90 (*)    All other components within normal limits  CBC  TROPONIN I (HIGH SENSITIVITY)  TROPONIN I (HIGH SENSITIVITY)     EKG  ED ECG REPORT I, Merlyn Lot, the attending physician, personally viewed and interpreted this ECG.   Date: 10/20/2021  EKG Time: 10:33  Rate: 70  Rhythm: sinus  Axis: normal  Intervals:iRBBB  ST&T Change: nonspecific st abn, no stemi    RADIOLOGY Please see ED Course for my review and interpretation.  I personally reviewed all radiographic images ordered to evaluate for the above acute complaints and reviewed radiology reports and findings.  These findings were personally discussed with the patient.  Please see medical record for radiology report.    PROCEDURES:  Critical Care performed: No  Procedures   MEDICATIONS ORDERED IN ED: Medications  amLODipine (NORVASC) tablet 5 mg (5 mg Oral Given 10/20/21 1323)  iohexol (OMNIPAQUE) 350 MG/ML injection 75 mL (75 mLs Intravenous Contrast Given 10/20/21 1430)     IMPRESSION / MDM / ASSESSMENT AND PLAN / ED COURSE  I reviewed the triage vital signs and the nursing notes.                              Differential diagnosis includes, but is not limited to, ACS, pericarditis, esophagitis, boerhaaves, pe, dissection, pna, bronchitis, costochondritis  Patient presenting to the ER with symptoms as described above.  This presenting complaint could reflect a potentially life-threatening illness therefore the patient will be placed on continuous pulse oximetry and telemetry for monitoring.  Laboratory evaluation will be sent to evaluate for the above complaints.  Patient well appearing clinically.  EKG non ischemic.  Initial trop negative.  Given pleuritic nature of pain will order  d-dimer to further risk stratify.  No wheezing on exam.     Clinical Course as of 10/20/21 1532  Mon Oct 20, 2021  1441 CTA on my review and interpretation does not show evidence of PE will await formal radiology report. [PR]    Clinical Course User Index [PR] Merlyn Lot, MD   Repeat trop negative. CTA negative for acute abn.  BP improved. Given history have considered hospitalization, but as symptoms have been present for sometime now with stable ekg and negative trops with pain worsening with movement, seems more likely non-cardiac chest wall pain that would be appropriate for close outpatient followup.  Have discussed with the patient and available family all diagnostics and treatments performed thus far and all questions were answered to the best of my ability. The patient demonstrates understanding and agreement with plan.  FINAL CLINICAL IMPRESSION(S) / ED DIAGNOSES   Final diagnoses:  Chest pain, unspecified type     Rx / DC Orders   ED Discharge Orders          Ordered    Ambulatory referral to Cardiology       Comments: If you have not heard from the Cardiology office within the next 72 hours please call [phone].   10/20/21 1515             Note:  This document was prepared using Dragon voice recognition software and may include unintentional dictation errors.    Merlyn Lot, MD 10/20/21 [phone]

## 2021-10-20 NOTE — ED Triage Notes (Addendum)
Pt to ED via POV from home. Pt reports he started having left sided CP with radiates to left arm and between shoulder blades that started yesterday and has gotten worse. Pt also reports SOB and dizziness. Pt also reports BP of 171/100 at home. Pt takes a daily ASA and took an extra one this morning.   Pt with hx sten placement 20yr ago. Pt denies blood thinners. Pt also states he was push mowing on Saturday and has been strength training.

## 2021-10-23 ENCOUNTER — Ambulatory Visit (INDEPENDENT_AMBULATORY_CARE_PROVIDER_SITE_OTHER): Payer: PPO | Admitting: Cardiology

## 2021-10-23 ENCOUNTER — Encounter: Payer: Self-pay | Admitting: Cardiology

## 2021-10-23 VITALS — BP 142/96 | HR 56 | Ht 66.0 in | Wt 184.2 lb

## 2021-10-23 DIAGNOSIS — R072 Precordial pain: Secondary | ICD-10-CM | POA: Diagnosis not present

## 2021-10-23 DIAGNOSIS — T466X5A Adverse effect of antihyperlipidemic and antiarteriosclerotic drugs, initial encounter: Secondary | ICD-10-CM

## 2021-10-23 DIAGNOSIS — R0789 Other chest pain: Secondary | ICD-10-CM

## 2021-10-23 DIAGNOSIS — M791 Myalgia, unspecified site: Secondary | ICD-10-CM

## 2021-10-23 DIAGNOSIS — Z9861 Coronary angioplasty status: Secondary | ICD-10-CM

## 2021-10-23 DIAGNOSIS — I10 Essential (primary) hypertension: Secondary | ICD-10-CM | POA: Diagnosis not present

## 2021-10-23 DIAGNOSIS — I2510 Atherosclerotic heart disease of native coronary artery without angina pectoris: Secondary | ICD-10-CM | POA: Diagnosis not present

## 2021-10-23 DIAGNOSIS — E785 Hyperlipidemia, unspecified: Secondary | ICD-10-CM | POA: Diagnosis not present

## 2021-10-23 NOTE — Progress Notes (Signed)
Primary Care Provider: Adin Hector, MD Previously followed by Dr. Ubaldo Glassing -> transferred care to Dr. Donnelly [facility]) => Dr. Ubaldo Glassing retired & Dr. Corky Sox is leaving. [facility] Cardiologist: Glenetta Hew, MD Electrophysiologist: None  Clinic Note: Chief Complaint  Patient presents with   Chest Pain    Patient states that he is doing great. He has been doing light work outs at home. Meds reviewed with patient.   Coronary Artery Disease    History of PCI to LCx in 2007   New Patient (Initial Visit)    Transitioning care from [facility]-Dr. Corky Sox is leaving   ===================================  ASSESSMENT/PLAN   Problem List Items Addressed This Visit       Cardiology Problems   CAD S/P percutaneous coronary angioplasty (Chronic)    15-1/2 years out from PCI to the LCx.  Was doing well as of June this year, but now had some chest pain earlier this week.  He does not really recall the symptoms he had back when he had his PCI.  But what he notes now is he just feels more tired and that his blood pressures been higher since his ER visit.  He is only on aspirin Lovaza and ARB-HCTZ (he takes half tablet twice daily, and until recently his blood pressures been pretty well controlled).Marland Kitchen   He is not on beta-blocker obviously because of bradycardia. Statin intolerant, therefore not anything besides Lovaza ostensibly for hypertriglyceridemia.. => We discussed potentially adding PCSK9 inhibitor pending cost assessment when he is seen in follow-up.      Relevant Orders   Cardiac Stress Test: Informed Consent Details: Physician/Practitioner Attestation; Transcribe to consent form and obtain patient signature   Hyperlipidemia LDL goal <70 (Chronic)    Unfortunately, he has been intolerant of multiple different statins in the past as well as fibrate.  LDL not at goal.  When seen in follow-up after his stress test, we will discuss referral to lipid clinic/cross  assessment for PCSK9 inhibitor.      Essential hypertension (Chronic)     Other   Precordial pain - Primary    Atypical sounding chest discomfort.  It really occurred at rest and was worse with deep breathing.  However, since this episode he has been feeling more tired and is also noticing his blood pressure being higher.  His last Myoview was in 2017 indicating that the diagonal branch of the LAD had a 90% stenosis was not showing signs of ischemia.  Since he is now having chest discomfort for the very first time since 2017, last not reasonable to get a baseline ischemic evaluation.  I would like to see how he does with exercise to see his exercise tolerance and heart rate responsiveness.  Plan: Exercise Treadmill Myoview Stress Test  Shared Decision Making/Informed Consent The risks [chest pain, shortness of breath, cardiac arrhythmias, dizziness, blood pressure fluctuations, myocardial infarction, stroke/transient ischemic attack, nausea, vomiting, allergic reaction, radiation exposure, metallic taste sensation and life-threatening complications (estimated to be 1 in 10,000)], benefits (risk stratification, diagnosing coronary artery disease, treatment guidance) and alternatives of a nuclear stress test were discussed in detail with Mr. Harville and he agrees to proceed.      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  He has not been on 1 of these for years.  He also tried fenofibrate and gemfibrozil both of which also caused myalgias.  He knows that he was on one of  the medication, but does not remember the name, that also caused symptoms.  (Most likely Zetia)  Plan will be to refer to lipid clinic to consider initiation of PCSK9 inhibitor or inclisiran..       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  Philis Fendt was last seen by Dr. Corky Sox on 09/17/2021.  He had no major complaints.  Doing well.  Does yard work and other odd jobs around the house with no limitation.  Enjoys using a push mower.  Noted that he would exercise 30 to 40 minutes a day 3 to 4 days a week before he strained his back.  Would usually walk and do light exercise. => Continue on aspirin.  Not requiring any anginal.  Statin intolerance, was on Lovaza 2 g twice daily.  LDL was 104.  BP well controlled plan was to follow-up in 1 year (June 2024 with Dr. Saralyn Pilar.  Recent Hospitalizations:  10/20/2021-[facility] ER evaluated with left sided chest pain that was worse with deep inspiration.  Noted BP was elevated (142/100 mmHg).  EKG was relatively benign.  PE protocol CT negative.  Was thought to be pleuritic type pain.  Troponin negative.  Told to follow-up with cardiology. Did not have appointment set up with Dr. Saralyn Pilar until June of next year, EDP recommended [place]  Reviewed  CV studies:    The following studies were reviewed today: (if available, images/films reviewed: From Epic Chart or Care Everywhere) Cardiac Cath and PCI November 2007 (Dr. Ubaldo Glassing): LCx 75% (Cypher DES PCI - Dr. Clayborn Bigness), 90% D1  Exercise Myoview ([facility]) 12/09/15: EF 51%.  Normal LV function.  No ischemia or infarction.  Interval History:   DYLON CORREA presents here today following his ER visit earlier this week.  He started having this  upper chest discomfort off and on that it began a couple days before just was bothering him so he went to the local fire department and his blood pressure was 170s over 90s.  He really describes as a left-sided chest discomfort that increases with deep inspiration, but he was concerned because of the high blood pressure issues.  He did say that this discomfort seem to occur more at rest.  Since then he is maybe noted little more fatigue and has not been exercising pending evaluation.  He is somewhat fearful now going back to his routine exercise.  Prior to this episode of chest pain he was walking 30-40 minutes a day as well as doing light exercise 3 to 5 days a week and not having any issues with any chest pain or pressure, just some back pain.   He says that Since His ER Visit, he really has been doing fine but is very concerned about exercising.  He denies having any further resting pain, but is now somewhat concerned about actually doing exercise.  He did walk very gently the following  day about 35 to minutes followed by some light exercise and did okay.  He is just concerned about doing more than that.  He says that the symptoms that he felt was similar to just not as bad as when he had a stent placed.  CV Review of Symptoms (Summary) Cardiovascular ROS: positive for - chest pain and starting to feel little more fatigued since the episode of chest pain.;  Has not really exerted himself often no exertional dyspnea since the episode. negative for - edema, irregular heartbeat, orthopnea, palpitations, paroxysmal nocturnal dyspnea, rapid heart rate, shortness of breath, or syncope/syncope or TIA//amaurosis fugax, claudication  REVIEWED OF SYSTEMS   Review of Systems  Constitutional:  Negative for chills, fever and malaise/fatigue (Has been somewhat immobile since ER visit).  HENT:  Negative for congestion and nosebleeds.   Respiratory:  Negative for cough and shortness of breath.   Cardiovascular:         Per HPI  Gastrointestinal:  Negative for blood in stool and melena.  Genitourinary:  Negative for dysuria and hematuria.  Musculoskeletal:  Negative for back pain, falls and joint pain.  Neurological:  Negative for dizziness, weakness and headaches.  Psychiatric/Behavioral:  Negative for depression and memory loss. The patient is not nervous/anxious and does not have insomnia.     I have reviewed and (if needed) personally updated the patient's problem list, medications, allergies, past medical and surgical history, social and family history.   PAST MEDICAL HISTORY   Past Medical History:  Diagnosis Date   Allergic state    Arthritis    Barrett's esophagus 03/05/2015   Benign colonic polyp    CAD S/P PCI 01/2006   CATH (Dr. Ubaldo Glassing); revealing 75% circumflex lesion, 90% D1.  => Cypher DES PCI LCx w/ 0% residual; Med Rx for D1.  (PCI - Dr. 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  Sleep study 9/17=> RDI 24.8, increasing to 49.2 at times. CPAP ordered   PAST SURGICAL HISTORY   Past Surgical History:  Procedure Laterality Date   APPENDECTOMY  1968   CHOLECYSTECTOMY  01/2007   Open:   COLONOSCOPY  11/2007   COLONOSCOPY WITH PROPOFOL N/A 06/01/2017   Procedure: COLONOSCOPY WITH PROPOFOL;  Surgeon: Lollie Sails, MD;  Location: [facility] ENDOSCOPY;  Service: Endoscopy;  Laterality: N/A;   CORONARY STENT INTERVENTION  01/2006   Dr. Clayborn Bigness => 75% LCx => 0%, Cypher DES   ESOPHAGOGASTRODUODENOSCOPY N/A 11/09/2019   Procedure: ESOPHAGOGASTRODUODENOSCOPY (EGD);  Surgeon:  Toledo, Benay Pike, MD;  Location: [facility] ENDOSCOPY;  Service: Gastroenterology;  Laterality: N/A;   ESOPHAGOGASTRODUODENOSCOPY (EGD) WITH PROPOFOL N/A 03/05/2015   Procedure: ESOPHAGOGASTRODUODENOSCOPY (EGD) WITH PROPOFOL;  Surgeon: Lollie Sails, MD;  Location: [facility] ENDOSCOPY;  Service: Endoscopy;  Laterality: N/A;   ESOPHAGOGASTRODUODENOSCOPY (EGD) WITH PROPOFOL N/A 06/01/2017   Procedure: ESOPHAGOGASTRODUODENOSCOPY (EGD) WITH PROPOFOL;  Surgeon: Lollie Sails, MD;  Location: [facility] ENDOSCOPY;  Service: Endoscopy;  Laterality: N/A;   LEFT HEART CATH AND  CORONARY ANGIOGRAPHY  01/2006   Dr. Ubaldo Glassing [facility]): LCx 75% (-> PCI), D2 90% (Med Rx).   MYOCARDIAL PERFUSION IMAGING  12/09/2015   [facility]: Exercise Myoview [facility]) 12/09/15: EF 51%.  Normal LV function.  No ischemia or infarction.   UPPER GI ENDOSCOPY  01/06/2013   Also 03/05/2015   Immunization History  Administered Date(s) Administered   Influenza Inj Mdck Quad Pf 03/17/2018, 03/21/2019   Influenza Split 02/13/2014   Influenza,inj,Quad PF,6+ Mos 01/20/2017   Influenza-Unspecified 01/15/2011, 01/15/2015   Pfizer Covid-19 Vaccine Bivalent Booster 69yr & up 01/28/2021   Pneumococcal Polysaccharide-23 09/02/2015   Tdap 08/05/2012, 05/03/2017   Zoster Recombinat (Shingrix) 01/20/2017   Zoster, Live 01/27/2016    MEDICATIONS/ALLERGIES   Current Meds  Medication Sig   acetaminophen (TYLENOL) 500 MG tablet Take 500 mg by mouth every 4 (four) hours as needed.   aspirin 81 MG tablet Take 81 mg by mouth daily.   EPINEPHrine (EPIPEN 2-PAK) 0.3 mg/0.3 mL IJ SOAJ injection Inject 0.3 mg into the muscle as needed for anaphylaxis.   Ibuprofen (MOTRIN PO) Take by mouth at bedtime as needed.   levothyroxine (SYNTHROID, LEVOTHROID) 25 MCG tablet Take 25 mcg by mouth daily before breakfast.   Menthol, Topical Analgesic, (ICY HOT EX) Apply topically.   Multiple Vitamin  (MULTIVITAMIN) tablet Take 1 tablet by mouth daily.   omega-3 acid ethyl esters (LOVAZA) 1 g capsule Take by mouth 2 (two) times daily.   pantoprazole (PROTONIX) 40 MG tablet Take 40 mg by mouth daily.   trolamine salicylate (ASPERCREME) 10 % cream Apply 1 application topically as needed for muscle pain.   valsartan-hydrochlorothiazide (DIOVAN-HCT) 160-25 MG tablet Take 1/2 tablet by mouth 2 (two) times daily.    Allergies  Allergen Reactions   Atorvastatin     Other reaction(s): Muscle Pain   Crestor [Rosuvastatin Calcium]    Gemfibrozil     Other reaction(s): Muscle Pain   Meperidine Nausea And Vomiting   Morphine     Other reaction(s): Vomiting   Morphine And Related    Rosuvastatin     Other reaction(s): Muscle Pain    SOCIAL HISTORY/FAMILY HISTORY   Reviewed in Epic:   Social History   Tobacco Use   Smoking status: Former    Packs/day: 0.50    Years: 0.50    Total pack years: 0.25    Types: Cigarettes   Smokeless tobacco: Never  Substance Use Topics   Alcohol use: No   Drug use: No     Social History   Social History Narrative   Not on file   Family History  Problem Relation Age of Onset   Hypertension Mother    Osteoporosis Mother    Heart attack Father 561  Breast cancer Sister    Colon cancer Brother     OBJCTIVE -PE, EKG, labs   Wt Readings from Last 3 Encounters:  10/23/21 184 lb 3.2 oz (83.6 kg)  10/20/21 180 lb (81.6 kg)  12/13/20 183 lb (83 kg)    Physical Exam: BP (!) 142/96 (BP Location: Right Arm, Patient Position: Sitting, Cuff Size: Normal)   Pulse (!) 56   Ht '5\' 6"'$  (1.676 m)   Wt 184 lb 3.2 oz (83.6 kg)   SpO2 96%   BMI 29.73 kg/m  => Normally blood pressure is normal in the 130s over 80s but have been higher since ER visit. Physical Exam Vitals reviewed.  Constitutional:  General: He is not in acute distress.    Appearance: Normal appearance. He is not ill-appearing (Healthy-appearing).     Comments: Well-nourished,  well-groomed.  Borderline obese.  HENT:     Head: Normocephalic and atraumatic.  Neck:     Vascular: No carotid bruit.  Cardiovascular:     Rate and Rhythm: Normal rate and regular rhythm. No extrasystoles are present.    Chest Wall: PMI is not displaced.     Pulses: Normal pulses.     Heart sounds: S2 normal. No murmur heard.    No friction rub. No gallop.  Pulmonary:     Effort: Pulmonary effort is normal. No respiratory distress.     Breath sounds: Normal breath sounds. No wheezing or rales.  Abdominal:     General: There is distension.  Musculoskeletal:        General: No swelling.     Cervical back: Normal range of motion and neck supple.  Skin:    General: Skin is warm and dry.  Neurological:     General: No focal deficit present.     Mental Status: He is alert and oriented to person, place, and time.     Motor: No weakness.     Gait: Gait normal.  Psychiatric:        Mood and Affect: Mood normal.        Behavior: Behavior normal.        Thought Content: Thought content normal.        Judgment: Judgment normal.     Adult ECG Report  Rate: 56 ;  Rhythm: sinus bradycardia and normal axis, intervals and durations.  Borderline RSR' in V1-V2 ;   Narrative Interpretation: Borderline  Recent Labs:   Reviewed.  Some labs checked today. No results found for: "CHOL", "HDL", "LDLCALC", "LDLDIRECT", "TRIG", "CHOLHDL" Lab Results  Component Value Date   CREATININE 0.85 10/20/2021   BUN 13 10/20/2021   NA 140 10/20/2021   K 3.2 (L) 10/20/2021   CL 106 10/20/2021   CO2 26 10/20/2021      Latest Ref Rng & Units 10/20/2021   10:40 AM  CBC  WBC 4.0 - 10.5 K/uL 6.6   Hemoglobin 13.0 - 17.0 g/dL 16.1   Hematocrit 39.0 - 52.0 % 46.2   Platelets 150 - 400 K/uL [place] Related to Lipid Panel w/calc LDL Component 09/18/21 04/22/21 09/16/20  Cholesterol, Total 196 188 186  Triglyceride 237 High  236 High  186  HDL (High Density Lipoprotein)  Cholesterol 36.5 37.3 39.3  LDL Calculated 112 104 110   Related to Thyroid Stimulating-Hormone (TSH) Component 10/16/21 09/18/21 04/22/21 03/18/21 09/16/20  Thyroid Stimulating Hormone (TSH) 4.015 5.343 High  3.803 5.373 High  3.972  Thyroxine, Free (FT4) 0.92  1.07      No results found for: "HGBA1C" No results found for: "TSH"  ================================================== I spent a total of 22 minutes with the patient spent in direct patient consultation.  Additional time spent with chart review  / charting (studies, outside notes, etc): 45 Total Time: 67 min  Current medicines are reviewed at length with the patient today.  (+/- concerns) n/a  Notice: This dictation was prepared with Dragon dictation along with smart phrase technology. Any transcriptional errors that result from this process are unintentional and may not be corrected upon review.   Studies Ordered:  Orders Placed This Encounter  Procedures   NM Myocar Multi W/Spect W/Wall Motion / EF  Cardiac Stress Test: Informed Consent Details: Physician/Practitioner Attestation; Transcribe to consent form and obtain patient signature   EKG 12-Lead   No orders of the defined types were placed in this encounter.   Patient Instructions / Medication Changes & Studies & Tests Ordered   Patient Instructions  Medication Instructions:  Your physician recommends that you continue on your current medications as directed. Please refer to the Current Medication list given to you today.  *If you need a refill on your cardiac medications before your next appointment, please call your pharmacy*   Lab Work: None ordered  If you have labs (blood work) drawn today and your tests are completely normal, you will receive your results only by: [place] (if you have MyChart) OR A paper copy in the mail If you have any lab test that is abnormal or we need to change your treatment, we will call you to review the  results.   Testing/Procedures: [place]  Your caregiver has ordered a Stress Test with nuclear imaging. The purpose of this test is to evaluate the blood supply to your heart muscle. This procedure is referred to as a "Non-Invasive Stress Test." This is because other than having an IV started in your vein, nothing is inserted or "invades" your body.  We recommend signing up for the patient portal called "MyChart".  Sign up information is provided on this After Visit Summary.  MyChart is used to connect with patients for Virtual Visits (Telemedicine).  Patients are able to view lab/test results, encounter notes, upcoming appointments, etc.  Non-urgent messages can be sent to your provider as well.   To learn more about what you can do with MyChart, go to [email or web address].    Your next appointment:   4-6 week(s)  The format for your next appointment:   In Person  Provider:   You may see Glenetta Hew, MD or one of the following Advanced Practice Providers on your designated Care Team:   Murray Hodgkins, NP Christell Faith, PA-C Cadence Kathlen Mody, [place]   Other Instructions N/A     Glenetta Hew, M.D., M.S. Interventional Cardiologist   Pager # [phone] Phone # [phone] [address] [place], [place] [address]   Thank you for choosing [facility] [place]!!

## 2021-10-23 NOTE — Patient Instructions (Signed)
Medication Instructions:  Your physician recommends that you continue on your current medications as directed. Please refer to the Current Medication list given to you today.  *If you need a refill on your cardiac medications before your next appointment, please call your pharmacy*   Lab Work: None ordered  If you have labs (blood work) drawn today and your tests are completely normal, you will receive your results only by: [place] (if you have MyChart) OR A paper copy in the mail If you have any lab test that is abnormal or we need to change your treatment, we will call you to review the results.   Testing/Procedures: [place]  Your caregiver has ordered a Stress Test with nuclear imaging. The purpose of this test is to evaluate the blood supply to your heart muscle. This procedure is referred to as a "Non-Invasive Stress Test." This is because other than having an IV started in your vein, nothing is inserted or "invades" your body. Cardiac stress tests are done to find areas of poor blood flow to the heart by determining the extent of coronary artery disease (CAD). Some patients exercise on a treadmill, which naturally increases the blood flow to your heart, while others who are  unable to walk on a treadmill due to physical limitations have a pharmacologic/chemical stress agent called Lexiscan . This medicine will mimic walking on a treadmill by temporarily increasing your coronary blood flow.   Please note: these test may take anywhere between 2-4 hours to complete  PLEASE REPORT TO [place] AT THE FIRST DESK WILL DIRECT YOU WHERE TO GO  Date of Procedure:_____________________________________  Arrival Time for Procedure:______________________________  Instructions regarding medication: You may take your regular medications  PLEASE NOTIFY THE OFFICE AT LEAST 24 HOURS IN ADVANCE IF YOU ARE UNABLE TO KEEP YOUR APPOINTMENT.   [phone] AND  PLEASE NOTIFY NUCLEAR MEDICINE AT [facility] AT LEAST 24 HOURS IN ADVANCE IF YOU ARE UNABLE TO KEEP YOUR APPOINTMENT. [phone]  How to prepare for your Myoview test:  Do not eat or drink after midnight No caffeine for 24 hours prior to test No smoking 24 hours prior to test. Your medication may be taken with water.  If your doctor stopped a medication because of this test, do not take that medication. Ladies, please do not wear dresses.  Skirts or pants are appropriate. Please wear a short sleeve shirt. No perfume, cologne or lotion. Wear comfortable walking shoes. No heels!   Follow-Up: At [facility], you and your health needs are our priority.  As part of our continuing mission to provide you with exceptional heart care, we have created designated Provider Care Teams.  These Care Teams include your primary Cardiologist (physician) and Advanced Practice Providers (APPs -  Physician Assistants and Nurse Practitioners) who all work together to provide you with the care you need, when you need it.  We recommend signing up for the patient portal called "MyChart".  Sign up information is provided on this After Visit Summary.  MyChart is used to connect with patients for Virtual Visits (Telemedicine).  Patients are able to view lab/test results, encounter notes, upcoming appointments, etc.  Non-urgent messages can be sent to your provider as well.   To learn more about what you can do with MyChart, go to [email or web address].    Your next appointment:   4-6 week(s)  The format for your next appointment:   In Person  Provider:   You may  see Glenetta Hew, MD or one of the following Advanced Practice Providers on your designated Care Team:   Murray Hodgkins, NP Christell Faith, PA-C Cadence Kathlen Mody, [place]   Other Instructions N/A  Important Information About Sugar

## 2021-10-25 ENCOUNTER — Encounter: Payer: Self-pay | Admitting: Cardiology

## 2021-10-25 DIAGNOSIS — E785 Hyperlipidemia, unspecified: Secondary | ICD-10-CM | POA: Insufficient documentation

## 2021-10-25 DIAGNOSIS — T466X5A Adverse effect of antihyperlipidemic and antiarteriosclerotic drugs, initial encounter: Secondary | ICD-10-CM | POA: Insufficient documentation

## 2021-10-25 DIAGNOSIS — I10 Essential (primary) hypertension: Secondary | ICD-10-CM | POA: Insufficient documentation

## 2021-10-25 NOTE — Assessment & Plan Note (Signed)
Atypical sounding chest discomfort.  It really occurred at rest and was worse with deep breathing.  However, since this episode he has been feeling more tired and is also noticing his blood pressure being higher.  His last Myoview was in 2017 indicating that the diagonal branch of the LAD had a 90% stenosis was not showing signs of ischemia.  Since he is now having chest discomfort for the very first time since 2017, last not reasonable to get a baseline ischemic evaluation.  I would like to see how he does with exercise to see his exercise tolerance and heart rate responsiveness.  Plan: Exercise Treadmill Myoview Stress Test  Shared Decision Making/Informed Consent The risks [chest pain, shortness of breath, cardiac arrhythmias, dizziness, blood pressure fluctuations, myocardial infarction, stroke/transient ischemic attack, nausea, vomiting, allergic reaction, radiation exposure, metallic taste sensation and life-threatening complications (estimated to be 1 in 10,000)], benefits (risk stratification, diagnosing coronary artery disease, treatment guidance) and alternatives of a nuclear stress test were discussed in detail with Derek Olsen and he agrees to proceed.

## 2021-10-25 NOTE — Assessment & Plan Note (Signed)
Unfortunately, his intolerance/allergy/concentration list shows atorvastatin, rosuvastatin and he recalls having taken simvastatin as well as pravastatin all of which have led to myalgias and arthralgias.  He has not been on 1 of these for years.  He also tried fenofibrate and gemfibrozil both of which also caused myalgias.  He knows that he was on one of the medication, but does not remember the name, that also caused symptoms.  (Most likely Zetia)  Plan will be to refer to lipid clinic to consider initiation of PCSK9 inhibitor or inclisiran.Marland Kitchen

## 2021-10-25 NOTE — Assessment & Plan Note (Signed)
15-1/2 years out from PCI to the LCx.  Was doing well as of June this year, but now had some chest pain earlier this week.  He does not really recall the symptoms he had back when he had his PCI.  But what he notes now is he just feels more tired and that his blood pressures been higher since his ER visit.  He is only on aspirin Lovaza and ARB-HCTZ (he takes half tablet twice daily, and until recently his blood pressures been pretty well controlled).Marland Kitchen   He is not on beta-blocker obviously because of bradycardia. Statin intolerant, therefore not anything besides Lovaza ostensibly for hypertriglyceridemia.. => We discussed potentially adding PCSK9 inhibitor pending cost assessment when he is seen in follow-up.

## 2021-10-25 NOTE — Assessment & Plan Note (Signed)
Unfortunately, he has been intolerant of multiple different statins in the past as well as fibrate.  LDL not at goal.  When seen in follow-up after his stress test, we will discuss referral to lipid clinic/cross assessment for PCSK9 inhibitor.

## 2021-10-28 ENCOUNTER — Encounter
Admission: RE | Admit: 2021-10-28 | Discharge: 2021-10-28 | Disposition: A | Discharge status: Home / Self Care | Payer: PPO | Source: Ambulatory Visit | Attending: Cardiology | Admitting: Cardiology

## 2021-10-28 DIAGNOSIS — R072 Precordial pain: Secondary | ICD-10-CM | POA: Insufficient documentation

## 2021-10-28 HISTORY — PX: NM GATED MYOVIEW (ARMC HX): HXRAD1856

## 2021-10-28 MED ORDER — TECHNETIUM TC 99M TETROFOSMIN IV KIT
10.2900 | PACK | Freq: Once | INTRAVENOUS | Status: AC | PRN
  Administered 2021-10-28: 10.29 via INTRAVENOUS

## 2021-10-28 MED ORDER — TECHNETIUM TC 99M TETROFOSMIN IV KIT: 10.0000 | PACK | Freq: Once | INTRAVENOUS | Status: DC

## 2021-10-28 MED ORDER — TECHNETIUM TC 99M TETROFOSMIN IV KIT
30.0000 | PACK | Freq: Once | INTRAVENOUS | Status: AC | PRN
  Administered 2021-10-28: 30.24 via INTRAVENOUS

## 2021-10-29 LAB — NM MYOCAR MULTI W/SPECT W/WALL MOTION / EF
Angina Index: 0
Base ST Depression (mm): 0 mm
Duke Treadmill Score: 11
Estimated workload: 12.5
Exercise duration (min): 10 min
Exercise duration (sec): 44 s
LV dias vol: 78 mL (ref 62–150)
LV sys vol: 33 mL
MPHR: 155 {beats}/min
Nuc Stress EF: 58 %
Peak HR: 133 {beats}/min
Percent HR: 85 %
Rest HR: 59 {beats}/min
Rest Nuclear Isotope Dose: 10.3 mCi
SDS: 0
SRS: 0
SSS: 1
ST Depression (mm): 0 mm
Stress Nuclear Isotope Dose: 30.2 mCi
TID: 0.91

## 2021-11-18 DIAGNOSIS — R300 Dysuria: Secondary | ICD-10-CM | POA: Diagnosis not present

## 2021-12-11 ENCOUNTER — Encounter: Payer: Self-pay | Admitting: Cardiology

## 2021-12-11 ENCOUNTER — Ambulatory Visit: Payer: PPO | Attending: Cardiology | Admitting: Cardiology

## 2021-12-11 ENCOUNTER — Telehealth: Payer: Self-pay

## 2021-12-11 VITALS — BP 134/88 | HR 59 | Ht 66.0 in | Wt 185.0 lb

## 2021-12-11 DIAGNOSIS — E785 Hyperlipidemia, unspecified: Secondary | ICD-10-CM

## 2021-12-11 DIAGNOSIS — M791 Myalgia, unspecified site: Secondary | ICD-10-CM | POA: Diagnosis not present

## 2021-12-11 DIAGNOSIS — Z9861 Coronary angioplasty status: Secondary | ICD-10-CM

## 2021-12-11 DIAGNOSIS — I2510 Atherosclerotic heart disease of native coronary artery without angina pectoris: Secondary | ICD-10-CM | POA: Diagnosis not present

## 2021-12-11 DIAGNOSIS — I10 Essential (primary) hypertension: Secondary | ICD-10-CM

## 2021-12-11 DIAGNOSIS — T466X5D Adverse effect of antihyperlipidemic and antiarteriosclerotic drugs, subsequent encounter: Secondary | ICD-10-CM

## 2021-12-11 MED ORDER — REPATHA SURECLICK 140 MG/ML ~~LOC~~ SOAJ: 140.0000 mg | SUBCUTANEOUS | 6 refills | Status: DC

## 2021-12-11 NOTE — Telephone Encounter (Signed)
Prior authorization for Repatha has been initiated via CoverMyMeds. [id number]  Response: [employer] has not yet replied to your PA request.    If [employer] has not replied to your request within 24 hours for expedited requests and 72 hours for standard requests please contact [employer] at [phone] .

## 2021-12-11 NOTE — Assessment & Plan Note (Addendum)
LCx PCI in 2007.  Nonischemic Myoview from last month-August 2023.  Normal EF. No longer having any chest pain issues.  I suspect that his chest pain was noncardiac in nature.  Back doing his routine exercise.  Plan:  On aspirin monotherapy-okay to hold for procedures or surgeries.  (5 to 7 days)  With baseline bradycardia, no beta-blocker  On ARB-valsartan-HCTZ 160-25 mg.  We do have room to increase if necessary but for now continue current dose.  He is on Lovaza for hypertriglyceridemia but has not been on anything for cholesterol.    Intolerant of statins-has tried 3 different meds.    Willing to try PCSK9 inhibitors. => We will prescribe Repatha, and recheck labs.

## 2021-12-11 NOTE — Progress Notes (Signed)
Primary Care Provider: Adin Hector, MD [facility] Cardiologist: None Previously followed by Dr. Ubaldo Glassing -> transferred care to Dr. Donnelly [facility]) => Dr. Ubaldo Glassing retired & Dr. Corky Sox moved. Electrophysiologist: None  Clinic Note: Chief Complaint  Patient presents with   Follow-up    2 months-test results   Coronary Artery Disease    My results reviewed.  No angina    ===================================  ASSESSMENT/PLAN   Problem List Items Addressed This Visit       Cardiology Problems   CAD S/P percutaneous coronary angioplasty - Primary (Chronic)    LCx PCI in 2007.  Nonischemic Myoview from last month-August 2023.  Normal EF. No longer having any chest pain issues.  I suspect that his chest pain was noncardiac in nature.  Back doing his routine exercise.  Plan: On aspirin monotherapy-okay to hold for procedures or surgeries.  (5 to 7 days) With baseline bradycardia, no beta-blocker On ARB-valsartan-HCTZ 160-25 mg.  We do have room to increase if necessary but for now continue current dose. He is on Lovaza for hypertriglyceridemia but has not been on anything for cholesterol.   Intolerant of statins-has tried 3 different meds.   Willing to try PCSK9 inhibitors. => We will prescribe Repatha, and recheck labs.      Relevant Medications   Evolocumab (REPATHA SURECLICK) 161 MG/ML SOAJ   Other Relevant Orders   EKG 12-Lead   Hyperlipidemia LDL goal <70 (Chronic)    Intolerant of simvastatin, atorvastatin and rosuvastatin with significant myalgias.  Also did not tolerate gemfibrozil.  He is on Lovaza.  Plan: Repatha 140 mg q. 14 days (Suspect PA paperwork which we will fill out.)      Relevant Medications   Evolocumab (REPATHA SURECLICK) 096 MG/ML SOAJ   Other Relevant Orders   Lipid Profile   Comp Met (CMET)   Essential hypertension (Chronic)    Borderline blood pressure today at 134 over 88 mmHg.  He says at home his  pressures are usually in the 120 mmHg range.  Continue to monitor.  He does have room to increase the ARB dose. No beta-blocker because of bradycardia.      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  Has been off of any kind of antilipid medication for many years now.  Since he is starting with me I would like to take advantage of PCSK9 inhibitors as an option.  Inclisiran will be another option.  We will start with Repatha 140 mg every 2 weeks.       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  He returns today to discuss results..    Pertinent PMH: CAD-DES PCI LCx (2007), HTN, HLD  Derek Olsen was seen on October 23, 2021 at the request of Adin Hector, MD following ER visit on July 24 for left-sided chest pain.  (Troponin negative, normal PE protocol CT).  Did not want to wait until June of next year to follow-up with Derek Olsen, therefore EDP recommended Derek Olsen => he noted off-and-on chest comfort for couple days associated with  blood pressures in the 170/90s that was concern).  It was left-sided discomfort worse with inspiration.  It hurt more at rest than with exertion.  Maybe little associated fatigue.  Did not want to exercise until an evaluation.  Usually walks 30 to 40 minutes a day as well as light exercise 3 to 5 days a week without any symptoms.  No resting pain.  He had not been exercising since the ER visit, but did walk a gentle 35-minute walk on the day prior to the visit and did relatively well.  Was concerned about tomorrow.  Recent Hospitalizations: None since ER visit  Reviewed  CV studies:    The following  studies were reviewed today: (if available, images/films reviewed: From Epic Chart or Care Everywhere) Exercise Myoview Stress Test (10/28/2021)-[facility]: EF 55-65%.  Normal perfusion without evidence of ischemia or infarction.  PVCs noted in the beginning of exercise, continue during stress.  Calcification noted as well as a circumflex stent.  Mild aortic atherosclerosis noted.  Hiatal hernia noted.  LOW RISK  Interval History:   Derek Olsen returns today doing quite well.  No major issues.  No more chest pain or pressure.  He has been back out mowing the lawn, and has already done some exercise today about 30 minutes.  Denied any further chest pain or pressure.  He denies any CHF symptoms of PND, orthopnea or edema and no palpitations or arrhythmia symptoms.  No syncope or near syncope.  He remains very active but does say over the last year and a half he has slowed down some on his level of activity.  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  Unfortunately over the last year and a half or so he got out of the habit and has not been as active.  May be down to 3 to 4 miles a day.  Now that he is seeing his negative stress test, he is hoping to get back into a more routine exercise regimen try to get his wife out with him as well.  Hoping to lose a little more weight.  He also, got off his diet.  They are both trying to lose some weight so they can adjust her diet and increase her exercise.  In the past, he has tried simvastatin, rosuvastatin, atorvastatin as well as gemfibrozil.  All of which have caused significant muscle aches and cramping.  He is to the point where he is reluctant to try to reason challenge himself because it caused him to lose track of his exercise when he first was on them.  He is open for trying new options, but is tired taking pills.  CV Review of Symptoms (Summary): no  chest pain or dyspnea on exertion negative for - edema, irregular heartbeat, orthopnea, palpitations, paroxysmal nocturnal dyspnea, rapid heart rate, shortness of breath, or lightheadedness, dizziness or wooziness, syncope/presyncope or TIA/amaurosis fugax.  On occasion.  REVIEWED OF SYSTEMS   Review of Systems  Constitutional:  Negative for malaise/fatigue and weight loss.  HENT:  Negative for nosebleeds.   Gastrointestinal:  Negative for blood in stool and melena.  Genitourinary:  Negative for hematuria.  Neurological:  Negative for dizziness.  All other systems reviewed and are negative.   I have reviewed and (if needed) personally updated the patient's problem list, medications, allergies, past medical and surgical history, social and family  history.   PAST MEDICAL HISTORY   Past Medical History:  Diagnosis Date   Allergic state    Arthritis    Barrett's esophagus 03/05/2015   Benign colonic polyp    CAD S/P PCI 01/2006   CATH (Dr. Ubaldo Glassing); revealing 75% circumflex lesion, 90% D1.  => Cypher DES PCI LCx w/ 0% residual; Med Rx for D1.  (PCI - Dr. 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  Sleep study 9/17=> RDI 24.8, increasing to 49.2 at times. CPAP ordered    PAST SURGICAL HISTORY   Past Surgical History:  Procedure Laterality Date   APPENDECTOMY  1968   CHOLECYSTECTOMY  01/2007   Open:   COLONOSCOPY  11/2007   COLONOSCOPY WITH PROPOFOL N/A 06/01/2017   Procedure: COLONOSCOPY WITH PROPOFOL;  Surgeon: Lollie Sails, MD;  Location: [facility] ENDOSCOPY;  Service:  Endoscopy;  Laterality: N/A;   CORONARY STENT INTERVENTION  01/2006   Dr. Clayborn Bigness => 75% LCx => 0%, Cypher DES   ESOPHAGOGASTRODUODENOSCOPY N/A 11/09/2019   Procedure: ESOPHAGOGASTRODUODENOSCOPY (EGD);  Surgeon: Toledo, Benay Pike, MD;  Location: [facility] ENDOSCOPY;  Service: Gastroenterology;  Laterality: N/A;   ESOPHAGOGASTRODUODENOSCOPY (EGD) WITH PROPOFOL N/A 03/05/2015   Procedure: ESOPHAGOGASTRODUODENOSCOPY (EGD) WITH PROPOFOL;  Surgeon: Lollie Sails, MD;  Location: [facility] ENDOSCOPY;  Service: Endoscopy;  Laterality: N/A;   ESOPHAGOGASTRODUODENOSCOPY (EGD) WITH PROPOFOL N/A 06/01/2017   Procedure: ESOPHAGOGASTRODUODENOSCOPY (EGD) WITH PROPOFOL;  Surgeon: Lollie Sails, MD;  Location: [facility] ENDOSCOPY;  Service: Endoscopy;  Laterality: N/A;   LEFT HEART CATH AND CORONARY ANGIOGRAPHY  01/2006   Dr. Ubaldo Glassing [facility]): LCx 75% (-> PCI), D2 90% (Med Rx).   MYOCARDIAL PERFUSION IMAGING  12/09/2015   [facility]: Exercise Myoview [facility]) 12/09/15: EF 51%.  Normal LV function.  No ischemia or infarction.   NM GATED MYOVIEW ([place] HX)  10/28/2020   Exercise Myoview: [facility]: EF 55-65%.  Normal perfusion without evidence of ischemia or infarction.  PVCs noted in the beginning of exercise, continue during stress.  Calcification noted as well as a circumflex stent.  Mild aortic atherosclerosis noted.  Hiatal hernia noted.  LOW RISK   UPPER GI ENDOSCOPY  01/06/2013   Also 03/05/2015    Immunization History  Administered Date(s) Administered   Influenza Inj Mdck Quad Pf 03/17/2018, 03/21/2019   Influenza Split 02/13/2014   Influenza,inj,Quad PF,6+ Mos 01/20/2017   Influenza-Unspecified 01/15/2011, 01/15/2015   Pfizer Covid-19 Vaccine Bivalent Booster 33yr & up 01/28/2021   Pneumococcal Polysaccharide-23 09/02/2015   Tdap 08/05/2012, 05/03/2017   Zoster Recombinat (Shingrix) 01/20/2017   Zoster, Live 01/27/2016    MEDICATIONS/ALLERGIES    Current Meds  Medication Sig   acetaminophen (TYLENOL) 500 MG tablet Take 500 mg by mouth every 4 (four) hours as needed.   aspirin 81 MG tablet Take 81 mg by mouth daily.   COVID-19 mRNA bivalent vaccine, Pfizer, (PFIZER COVID-19 VAC BIVALENT) injection Inject into the muscle.   EPINEPHrine (EPIPEN 2-PAK) 0.3 mg/0.3  mL IJ SOAJ injection Inject 0.3 mg into the muscle as needed for anaphylaxis.   Evolocumab (REPATHA SURECLICK) 622 MG/ML SOAJ Inject 140 mg into the skin every 14 (fourteen) days.   fluocinonide (LIDEX) 0.05 % external solution Apply 1 application  topically 2 (two) times daily.   Ibuprofen (MOTRIN PO) Take by mouth at bedtime as needed.   levothyroxine (SYNTHROID, LEVOTHROID) 25 MCG tablet Take 25 mcg by mouth daily before breakfast.   Menthol, Topical Analgesic, (ICY HOT EX) Apply topically.   Multiple Vitamin (MULTIVITAMIN) tablet Take 1 tablet by mouth daily.   omega-3 acid ethyl esters (LOVAZA) 1 g capsule Take by mouth 2 (two) times daily.   pantoprazole (PROTONIX) 40 MG tablet Take 40 mg by mouth daily.   trolamine salicylate (ASPERCREME) 10 % cream Apply 1 application topically as needed for muscle pain.   valsartan-hydrochlorothiazide (DIOVAN-HCT) 160-25 MG tablet Take 1 tablet by mouth 2 (two) times daily.    Allergies  Allergen Reactions   Atorvastatin     Other reaction(s): Muscle Pain   Crestor [Rosuvastatin Calcium]    Gemfibrozil     Other reaction(s): Muscle Pain   Meperidine Nausea And Vomiting   Morphine     Other reaction(s): Vomiting   Morphine And Related    Simvastatin Other (See Comments)    Myalgias    SOCIAL HISTORY/FAMILY HISTORY   Reviewed in Epic:  Pertinent findings:  Social History   Tobacco Use   Smoking status: Former    Packs/day: 0.50    Years: 0.50    Total pack years: 0.25    Types: Cigarettes   Smokeless tobacco: Never  Vaping Use   Vaping Use: Never used  Substance Use Topics   Alcohol use: No   Drug use: No    Social History   Social History Narrative   Not on file    OBJCTIVE -PE, EKG, labs   Wt Readings from Last 3 Encounters:  12/11/21 185 lb (83.9 kg)  10/23/21 184 lb 3.2 oz (83.6 kg)  10/20/21 180 lb (81.6 kg)    Physical Exam: BP 134/88   Pulse (!) 59   Ht 5' 6"  (1.676 m)   Wt 185 lb (83.9 kg)   SpO2 98%   BMI 29.86 kg/m  Physical Exam Vitals reviewed.  Constitutional:      General: He is not in acute distress.    Appearance: Normal appearance. He is normal weight. He is not ill-appearing or toxic-appearing.     Comments: Borderline obese, but healthy-appearing.  Well-nourished, well-groomed.  HENT:     Head: Normocephalic and atraumatic.  Neck:     Vascular: No carotid bruit or JVD.  Cardiovascular:     Rate and Rhythm: Normal rate and regular rhythm.     Chest Wall: PMI is not displaced.     Pulses: Normal pulses.     Heart sounds: Normal heart sounds, S1 normal and S2 normal. No murmur heard.    No friction rub. No gallop.  Pulmonary:     Effort: Pulmonary effort is normal. No respiratory distress.     Breath sounds: Normal breath sounds. No wheezing, rhonchi or rales.  Musculoskeletal:        General: No swelling. Normal range of motion.     Cervical back: Normal range of motion and neck supple.  Skin:    General: Skin is warm and dry.  Neurological:     General: No focal deficit present.     Mental Status:  He is alert and oriented to person, place, and time. Mental status is at baseline.     Gait: Gait normal.  Psychiatric:        Mood and Affect: Mood normal.        Behavior: Behavior normal.        Thought Content: Thought content normal.        Judgment: Judgment normal.     Adult ECG Report  Rate: 59 ;  Rhythm: sinus bradycardia and normal axis, intervals and durations. ;   Narrative Interpretation: Stable  Recent Labs:   [place] Related to Lipid Panel w/calc LDL Component 09/18/21 04/22/21 03/18/21 09/16/20 03/13/20  09/14/19  Cholesterol, Total 196 188 193 186 180 195  Triglyceride 237 High  236 High  401 High   186 178 233 High   HDL  36.5 37.3 34.1 39.3 34.9 37.3  LDL Calc 112 104 -- 110      No results found for: "CHOL", "HDL", "LDLCALC", "LDLDIRECT", "TRIG", "CHOLHDL" Lab Results  Component Value Date   CREATININE 0.85 10/20/2021   BUN 13 10/20/2021   NA 140 10/20/2021   K 3.2 (L) 10/20/2021   CL 106 10/20/2021   CO2 26 10/20/2021      Latest Ref Rng & Units 10/20/2021   10:40 AM  CBC  WBC 4.0 - 10.5 K/uL 6.6   Hemoglobin 13.0 - 17.0 g/dL 16.1   Hematocrit 39.0 - 52.0 % 46.2   Platelets 150 - 400 K/uL 224     No results found for: "HGBA1C" No results found for: "TSH"  ================================================== I spent a total of 19 minutes with the patient spent in direct patient consultation.  Additional time spent with chart review  / charting (studies, outside notes, etc): 24 min Total Time: 43 min  Current medicines are reviewed at length with the patient today.  (+/- concerns) would prefer not to take more pills.  Notice: This dictation was prepared with Dragon dictation along with smart phrase technology. Any transcriptional errors that result from this process are unintentional and may not be corrected upon review.  Studies Ordered:  Orders Placed This Encounter  Procedures   Lipid Profile   Comp Met (CMET)   EKG 12-Lead   Meds ordered this encounter  Medications   Evolocumab (REPATHA SURECLICK) 308 MG/ML SOAJ    Sig: Inject 140 mg into the skin every 14 (fourteen) days.    Dispense:  2 mL    Refill:  6    Patient Instructions / Medication Changes & Studies & Tests Ordered   Patient Instructions  Medication Instructions:  - Your physician has recommended you make the following change in your medication:   1) START Repatha 140 mg: - inject 1 mL into the skin every 2 weeks  *If you need a refill on your cardiac medications before your next  appointment, please call your pharmacy*   Lab Work: - Your physician recommends that you return for lab work: 4 months after starting Repatha  (We will be in touch with you closer to the time you are due for labs to remind you of this)  [place] Entrance at [facility] 1st desk on the right to check in (REGISTRATION)  Lab hours: Monday- Friday (7:30 am- 5:30 pm)   If you have labs (blood work) drawn today and your tests are completely normal, you will receive your results only by: [place] (if you have MyChart) OR A paper copy  in the mail If you have any lab test that is abnormal or we need to change your treatment, we will call you to review the results.   Testing/Procedures: - none ordered   Follow-Up: At [facility], you and your health needs are our priority.  As part of our continuing mission to provide you with exceptional heart care, we have created designated Provider Care Teams.  These Care Teams include your primary Cardiologist (physician) and Advanced Practice Providers (APPs -  Physician Assistants and Nurse Practitioners) who all work together to provide you with the care you need, when you need it.  We recommend signing up for the patient portal called "MyChart".  Sign up information is provided on this After Visit Summary.  MyChart is used to connect with patients for Virtual Visits (Telemedicine).  Patients are able to view lab/test results, encounter notes, upcoming appointments, etc.  Non-urgent messages can be sent to your provider as well.   To learn more about what you can do with MyChart, go to [email or web address].    Your next appointment:   1 year(s)  The format for your next appointment:   In Person  Provider:   Glenetta Hew, MD    Other Instructions  Evolocumab Injection What is this medication? EVOLOCUMAB (e voe LOK ue mab) treats high cholesterol. It may also be used to lower the risk of heart attack, stroke, and  a type of heart surgery. It works by decreasing bad cholesterol (such as LDL) in your blood. It is a monoclonal antibody. Changes to diet and exercise are often combined with this medication. This medicine may be used for other purposes; ask your health care provider or pharmacist if you have questions. COMMON BRAND NAME(S): Repatha, Repatha SureClick What should I tell my care team before I take this medication? They need to know if you have any of these conditions: An unusual or allergic reaction to evolocumab, latex, other medications, foods, dyes, or preservatives Pregnant or trying to get pregnant Breast-feeding How should I use this medication? This medication is injected under the skin. You will be taught how to prepare and give it. Take it as directed on the prescription label at the same time every day. Keep taking it unless your care team tells you to stop. It is important that you put your used needles and syringes in a special sharps container. Do not put them in a trash can. If you do not have a sharps container, call your pharmacist or care team to get one. This medication comes with INSTRUCTIONS FOR USE. Ask your pharmacist for directions on how to use this medication. Read the information carefully. Talk to your pharmacist or care team if you have questions. Talk to your care team about the use of this medication in children. While it may be prescribed for children as young as 10 years for selected conditions, precautions do apply. Overdosage: If you think you have taken too much of this medicine contact a poison control center or emergency room at once. NOTE: This medicine is only for you. Do not share this medicine with others. What if I miss a dose? It is important not to miss any doses. Talk to your care team about what to do if you miss a dose. What may interact with this medication? Interactions are not expected. This list may not describe all possible interactions. Give your  health care provider a list of all the medicines, herbs, non-prescription drugs, or dietary supplements you use.  Also tell them if you smoke, drink alcohol, or use illegal drugs. Some items may interact with your medicine. What should I watch for while using this medication? Visit your care team for regular checks on your progress. Tell your care team if your symptoms do not start to get better or if they get worse. You may need blood work while you are taking this medication. Do not wear the on-body infuser during an MRI. Taking this medication is only part of a total heart healthy program. Ask your care team if there are other changes you can make to improve your overall health. What side effects may I notice from receiving this medication? Side effects that you should report to your care team as soon as possible: Allergic reactions or angioedema--skin rash, itching or hives, swelling of the face, eyes, lips, tongue, arms, or legs, trouble swallowing or breathing Side effects that usually do not require medical attention (report to your care team if they continue or are bothersome): Back pain Flu-like symptoms--fever, chills, muscle pain, cough, headache, fatigue Pain, redness, or irritation at injection site Runny or stuffy nose Sore throat This list may not describe all possible side effects. Call your doctor for medical advice about side effects. You may report side effects to FDA at 1-800-FDA-1088. Where should I keep my medication? Keep out of the reach of children and pets. Store in a refrigerator or at room temperature between 20 and 25 degrees C (68 and 77 degrees F). Refrigeration (preferred): Store it in the refrigerator. Do not freeze. Keep it in the original carton until you are ready to take it. Remove the dose from the carton about 30 minutes before it is time for you to take it. Get rid of any unused medication after the expiration date. Room temperature: This medication may be  stored at room temperature for up to 30 days. Keep it in the original carton until you are ready to take it. If it is stored at room temperature, get rid of any unused medication after 30 days or after it expires, whichever is first. Protect from light. Do not shake. Avoid exposure to extreme heat. To get rid of medications that are no longer needed or have expired: Take the medication to a medication take-back program. Check with your pharmacy or law enforcement to find a location. If you cannot return the medication, ask your pharmacist or care team how to get rid of this medication safely. NOTE: This sheet is a summary. It may not cover all possible information. If you have questions about this medicine, talk to your doctor, pharmacist, or health care provider.  2023 Elsevier/Gold Standard (2021-03-18 00:00:00)   Important Information About Sugar             Leonie Man, MD, MS Glenetta Hew, M.D., M.S. Interventional Cardiologist  [facility]   [address] [place], [place]  [address] [phone]           Fax [phone]    Thank you for choosing [facility] in [place]!!

## 2021-12-11 NOTE — Assessment & Plan Note (Signed)
He has tried the 3 major types of statins without being only tolerated them because of significant myalgias.  Has been off of any kind of antilipid medication for many years now.  Since he is starting with me I would like to take advantage of PCSK9 inhibitors as an option.  Inclisiran will be another option.  We will start with Repatha 140 mg every 2 weeks.

## 2021-12-11 NOTE — Patient Instructions (Addendum)
Medication Instructions:  - Your physician has recommended you make the following change in your medication:   1) START Repatha 140 mg: - inject 1 mL into the skin every 2 weeks  *If you need a refill on your cardiac medications before your next appointment, please call your pharmacy*   Lab Work: - Your physician recommends that you return for lab work: 4 months after starting Repatha  (We will be in touch with you closer to the time you are due for labs to remind you of this)  [place] Entrance at [facility] 1st desk on the right to check in (REGISTRATION)  Lab hours: Monday- Friday (7:30 am- 5:30 pm)   If you have labs (blood work) drawn today and your tests are completely normal, you will receive your results only by: [place] (if you have MyChart) OR A paper copy in the mail If you have any lab test that is abnormal or we need to change your treatment, we will call you to review the results.   Testing/Procedures: - none ordered   Follow-Up: At [facility], you and your health needs are our priority.  As part of our continuing mission to provide you with exceptional heart care, we have created designated Provider Care Teams.  These Care Teams include your primary Cardiologist (physician) and Advanced Practice Providers (APPs -  Physician Assistants and Nurse Practitioners) who all work together to provide you with the care you need, when you need it.  We recommend signing up for the patient portal called "MyChart".  Sign up information is provided on this After Visit Summary.  MyChart is used to connect with patients for Virtual Visits (Telemedicine).  Patients are able to view lab/test results, encounter notes, upcoming appointments, etc.  Non-urgent messages can be sent to your provider as well.   To learn more about what you can do with MyChart, go to [email or web address].    Your next appointment:   1 year(s)  The format for your next  appointment:   In Person  Provider:   Glenetta Hew, MD    Other Instructions  Evolocumab Injection What is this medication? EVOLOCUMAB (e voe LOK ue mab) treats high cholesterol. It may also be used to lower the risk of heart attack, stroke, and a type of heart surgery. It works by decreasing bad cholesterol (such as LDL) in your blood. It is a monoclonal antibody. Changes to diet and exercise are often combined with this medication. This medicine may be used for other purposes; ask your health care provider or pharmacist if you have questions. COMMON BRAND NAME(S): Repatha, Repatha SureClick What should I tell my care team before I take this medication? They need to know if you have any of these conditions: An unusual or allergic reaction to evolocumab, latex, other medications, foods, dyes, or preservatives Pregnant or trying to get pregnant Breast-feeding How should I use this medication? This medication is injected under the skin. You will be taught how to prepare and give it. Take it as directed on the prescription label at the same time every day. Keep taking it unless your care team tells you to stop. It is important that you put your used needles and syringes in a special sharps container. Do not put them in a trash can. If you do not have a sharps container, call your pharmacist or care team to get one. This medication comes with INSTRUCTIONS FOR USE. Ask your pharmacist for directions on how  to use this medication. Read the information carefully. Talk to your pharmacist or care team if you have questions. Talk to your care team about the use of this medication in children. While it may be prescribed for children as young as 10 years for selected conditions, precautions do apply. Overdosage: If you think you have taken too much of this medicine contact a poison control center or emergency room at once. NOTE: This medicine is only for you. Do not share this medicine with  others. What if I miss a dose? It is important not to miss any doses. Talk to your care team about what to do if you miss a dose. What may interact with this medication? Interactions are not expected. This list may not describe all possible interactions. Give your health care provider a list of all the medicines, herbs, non-prescription drugs, or dietary supplements you use. Also tell them if you smoke, drink alcohol, or use illegal drugs. Some items may interact with your medicine. What should I watch for while using this medication? Visit your care team for regular checks on your progress. Tell your care team if your symptoms do not start to get better or if they get worse. You may need blood work while you are taking this medication. Do not wear the on-body infuser during an MRI. Taking this medication is only part of a total heart healthy program. Ask your care team if there are other changes you can make to improve your overall health. What side effects may I notice from receiving this medication? Side effects that you should report to your care team as soon as possible: Allergic reactions or angioedema--skin rash, itching or hives, swelling of the face, eyes, lips, tongue, arms, or legs, trouble swallowing or breathing Side effects that usually do not require medical attention (report to your care team if they continue or are bothersome): Back pain Flu-like symptoms--fever, chills, muscle pain, cough, headache, fatigue Pain, redness, or irritation at injection site Runny or stuffy nose Sore throat This list may not describe all possible side effects. Call your doctor for medical advice about side effects. You may report side effects to FDA at 1-800-FDA-1088. Where should I keep my medication? Keep out of the reach of children and pets. Store in a refrigerator or at room temperature between 20 and 25 degrees C (68 and 77 degrees F). Refrigeration (preferred): Store it in the refrigerator.  Do not freeze. Keep it in the original carton until you are ready to take it. Remove the dose from the carton about 30 minutes before it is time for you to take it. Get rid of any unused medication after the expiration date. Room temperature: This medication may be stored at room temperature for up to 30 days. Keep it in the original carton until you are ready to take it. If it is stored at room temperature, get rid of any unused medication after 30 days or after it expires, whichever is first. Protect from light. Do not shake. Avoid exposure to extreme heat. To get rid of medications that are no longer needed or have expired: Take the medication to a medication take-back program. Check with your pharmacy or law enforcement to find a location. If you cannot return the medication, ask your pharmacist or care team how to get rid of this medication safely. NOTE: This sheet is a summary. It may not cover all possible information. If you have questions about this medicine, talk to your doctor, pharmacist, or health  care provider.  2023 Elsevier/Gold Standard (2021-03-18 00:00:00)   Important Information About Sugar

## 2021-12-11 NOTE — Assessment & Plan Note (Signed)
Borderline blood pressure today at 134 over 88 mmHg.  He says at home his pressures are usually in the 120 mmHg range.  Continue to monitor.  He does have room to increase the ARB dose. No beta-blocker because of bradycardia.

## 2021-12-11 NOTE — Assessment & Plan Note (Signed)
Intolerant of simvastatin, atorvastatin and rosuvastatin with significant myalgias.  Also did not tolerate gemfibrozil.  He is on Lovaza.  Plan: Repatha 140 mg q. 14 days (Suspect PA paperwork which we will fill out.)

## 2021-12-11 NOTE — Telephone Encounter (Signed)
Approval Response: This request has received a Favorable outcome.  Please note any additional information provided by [employer] at the bottom of this request.  14-SEP-23:14-MAR-24 Quantity:;Repatha SureClick '140MG'$ /ML  SOAJ Quantity:2; Quantity:

## 2021-12-12 ENCOUNTER — Encounter: Payer: Self-pay | Admitting: Cardiology

## 2021-12-13 ENCOUNTER — Encounter: Payer: Self-pay | Admitting: Cardiology

## 2022-01-06 DIAGNOSIS — D2262 Melanocytic nevi of left upper limb, including shoulder: Secondary | ICD-10-CM | POA: Diagnosis not present

## 2022-01-06 DIAGNOSIS — L821 Other seborrheic keratosis: Secondary | ICD-10-CM | POA: Diagnosis not present

## 2022-01-06 DIAGNOSIS — D2272 Melanocytic nevi of left lower limb, including hip: Secondary | ICD-10-CM | POA: Diagnosis not present

## 2022-01-06 DIAGNOSIS — L570 Actinic keratosis: Secondary | ICD-10-CM | POA: Diagnosis not present

## 2022-01-06 DIAGNOSIS — D2271 Melanocytic nevi of right lower limb, including hip: Secondary | ICD-10-CM | POA: Diagnosis not present

## 2022-01-06 DIAGNOSIS — D220 Melanocytic nevi of lip: Secondary | ICD-10-CM | POA: Diagnosis not present

## 2022-01-06 DIAGNOSIS — D225 Melanocytic nevi of trunk: Secondary | ICD-10-CM | POA: Diagnosis not present

## 2022-01-06 DIAGNOSIS — D2261 Melanocytic nevi of right upper limb, including shoulder: Secondary | ICD-10-CM | POA: Diagnosis not present

## 2022-03-20 DIAGNOSIS — E785 Hyperlipidemia, unspecified: Secondary | ICD-10-CM | POA: Diagnosis not present

## 2022-03-20 DIAGNOSIS — M19012 Primary osteoarthritis, left shoulder: Secondary | ICD-10-CM | POA: Diagnosis not present

## 2022-03-20 DIAGNOSIS — I10 Essential (primary) hypertension: Secondary | ICD-10-CM | POA: Diagnosis not present

## 2022-03-20 DIAGNOSIS — K219 Gastro-esophageal reflux disease without esophagitis: Secondary | ICD-10-CM | POA: Diagnosis not present

## 2022-03-20 DIAGNOSIS — G4733 Obstructive sleep apnea (adult) (pediatric): Secondary | ICD-10-CM | POA: Diagnosis not present

## 2022-03-20 DIAGNOSIS — I2510 Atherosclerotic heart disease of native coronary artery without angina pectoris: Secondary | ICD-10-CM | POA: Diagnosis not present

## 2022-03-27 DIAGNOSIS — T466X5A Adverse effect of antihyperlipidemic and antiarteriosclerotic drugs, initial encounter: Secondary | ICD-10-CM | POA: Diagnosis not present

## 2022-03-27 DIAGNOSIS — K219 Gastro-esophageal reflux disease without esophagitis: Secondary | ICD-10-CM | POA: Diagnosis not present

## 2022-03-27 DIAGNOSIS — E785 Hyperlipidemia, unspecified: Secondary | ICD-10-CM | POA: Diagnosis not present

## 2022-03-27 DIAGNOSIS — Z125 Encounter for screening for malignant neoplasm of prostate: Secondary | ICD-10-CM | POA: Diagnosis not present

## 2022-03-27 DIAGNOSIS — I10 Essential (primary) hypertension: Secondary | ICD-10-CM | POA: Diagnosis not present

## 2022-03-27 DIAGNOSIS — G4733 Obstructive sleep apnea (adult) (pediatric): Secondary | ICD-10-CM | POA: Diagnosis not present

## 2022-03-27 DIAGNOSIS — G720 Drug-induced myopathy: Secondary | ICD-10-CM | POA: Diagnosis not present

## 2022-03-27 DIAGNOSIS — I2510 Atherosclerotic heart disease of native coronary artery without angina pectoris: Secondary | ICD-10-CM | POA: Diagnosis not present

## 2022-03-28 ENCOUNTER — Encounter: Payer: Self-pay | Admitting: Cardiology

## 2022-04-02 NOTE — Telephone Encounter (Signed)
Labs reviewed.  Cholesterol levels look excellent.  Other labs look good.  Continue current meds. Glenetta Hew, MD

## 2022-04-26 ENCOUNTER — Encounter: Payer: Self-pay | Admitting: Cardiology

## 2022-06-08 ENCOUNTER — Telehealth: Payer: Self-pay | Admitting: Pharmacist Clinician (PhC)/ Clinical Pharmacy Specialist

## 2022-06-08 ENCOUNTER — Encounter: Payer: Self-pay | Admitting: Cardiology

## 2022-06-08 ENCOUNTER — Other Ambulatory Visit (HOSPITAL_COMMUNITY): Payer: Self-pay

## 2022-06-08 NOTE — Telephone Encounter (Signed)
Repatha PA please

## 2022-06-08 NOTE — Telephone Encounter (Signed)
Has HTA done away with prior authorization for Repatha??  He has a letter telling him the PA expires later this month.      04/26/22  8:36 PM My insurance requires a letter from you requesting continued coverage for the Repatha (see attached document). Do you feel the medication has been beneficial and do you wish for me to continue?  If so, would you be willing to assist with the required letter?  Attachments  [email or web address]

## 2022-06-08 NOTE — Telephone Encounter (Signed)
Per test claim no PA is required. Last filled 05/19/22, insurance will pay again 06/09/22.

## 2022-06-09 ENCOUNTER — Other Ambulatory Visit (HOSPITAL_COMMUNITY): Payer: Self-pay

## 2022-06-09 MED ORDER — REPATHA SURECLICK 140 MG/ML ~~LOC~~ SOAJ
140.0000 mg | SUBCUTANEOUS | 3 refills | Status: DC
  Filled 2023-02-16: qty 2, 28d supply, fill #0
  Filled 2023-04-06: qty 2, 28d supply, fill #1
  Filled 2023-04-27: qty 2, 28d supply, fill #2

## 2022-06-09 NOTE — Addendum Note (Signed)
Addended by: Rockne Menghini on: 06/09/2022 02:55 PM   Modules accepted: Orders

## 2022-06-09 NOTE — Telephone Encounter (Signed)
May just have been too early to submit? I'll try again now.

## 2022-06-09 NOTE — Telephone Encounter (Signed)
  He's good to go. Not sure if someone else did the PA but test claim shows it's covered.

## 2022-08-10 IMAGING — RF DG ESOPHAGUS
11 of 12 series · 15 of 16 positions shown · non-contrast
Comparison: CT chest 06/14/2018.

CLINICAL DATA: Large hiatal hernia.

EXAM:
ESOPHOGRAM/BARIUM SWALLOW
TECHNIQUE: Combined double contrast and single contrast examination performed
using effervescent crystals, thick barium liquid, and thin barium
liquid.
FLUOROSCOPY TIME:  Fluoroscopy Time:  1 minutes 30 seconds
Radiation Exposure Index (if provided by the fluoroscopic device):
46.8 mGy

[Series 1: fluoro_barium 2fps_bw · 0.17mm/px · 3 of 8 frames shown (1 of 11)]
[frame 2/8]
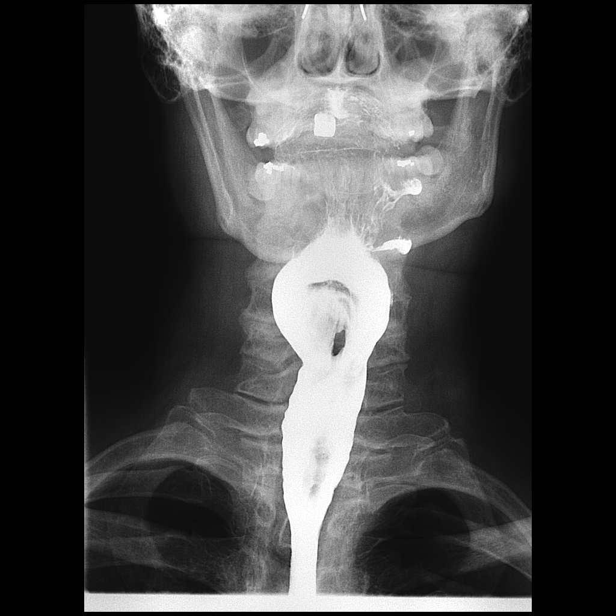
[frame 5/8]
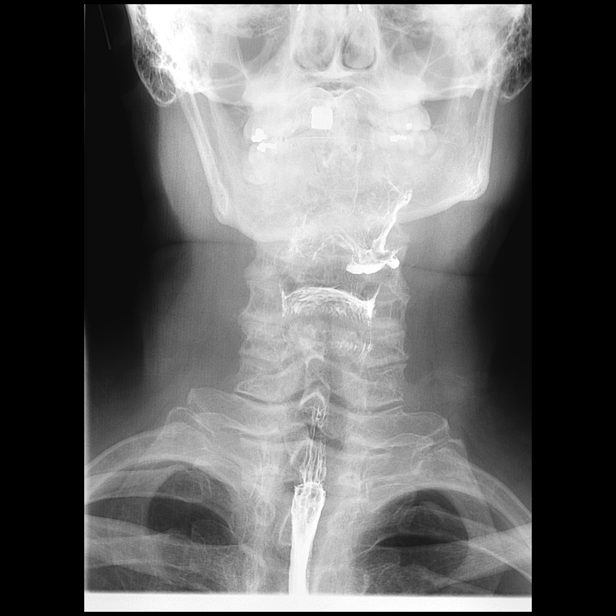
[frame 7/8]
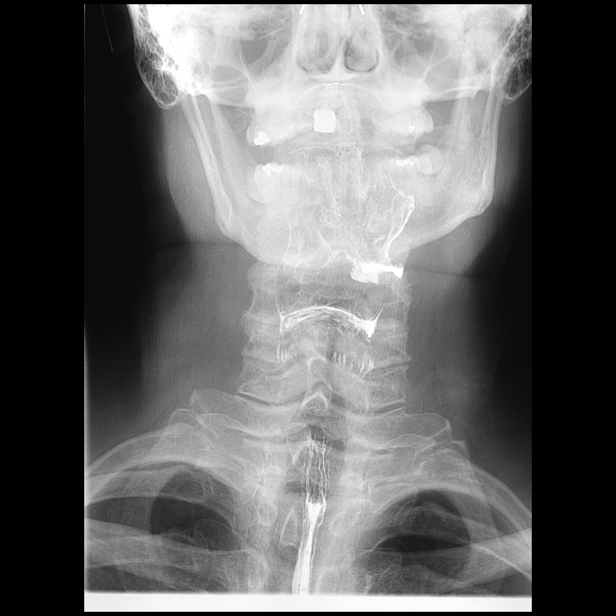

[Series 2: fluoro_barium 2fps_bw · 0.17mm/px · 3 of 4 frames shown (2 of 11)]
[frame 1/4]
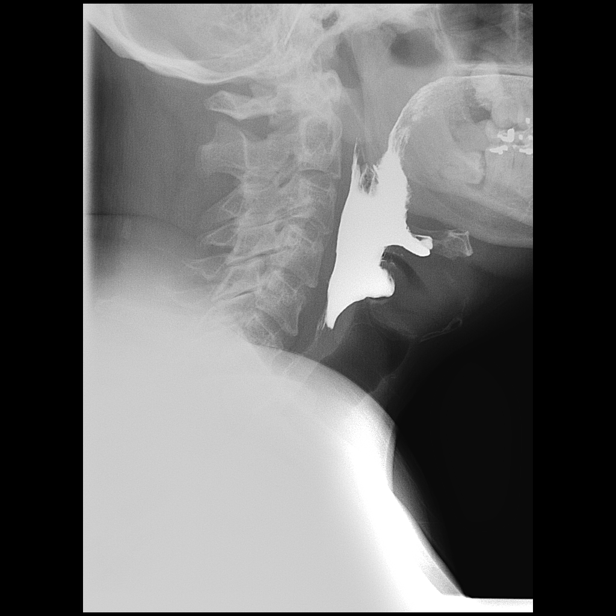
[frame 3/4]
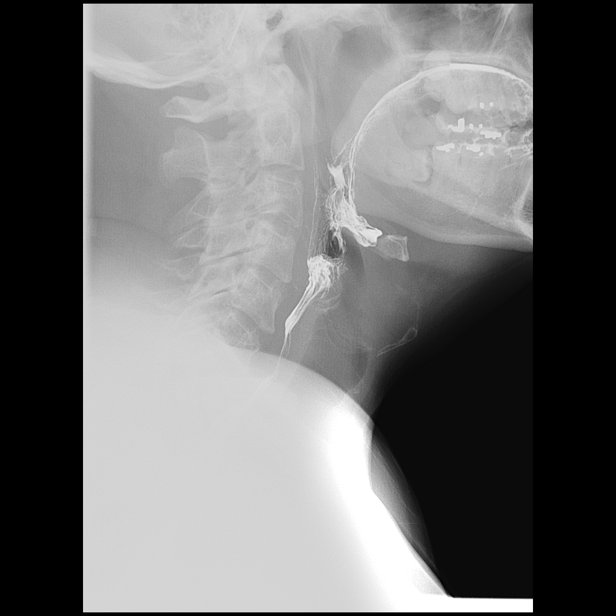
[frame 4/4]
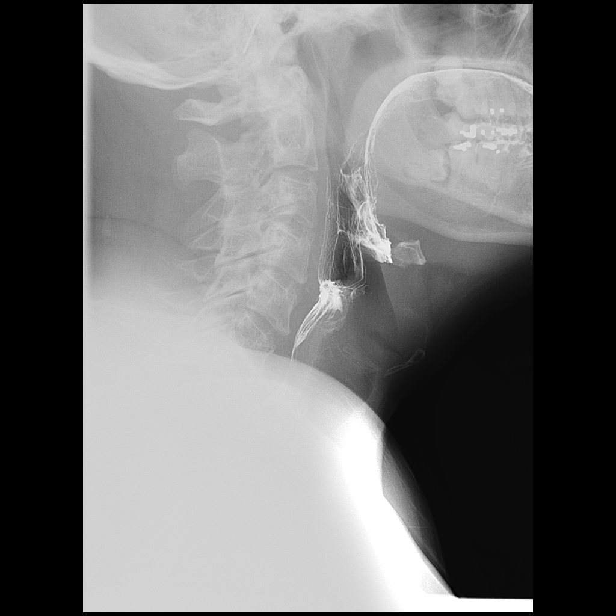

[Series 3: fluoro_barium 2fps_bw · 0.17mm/px · 1 of 1 slices shown (3 of 11)]
[im 1/1]
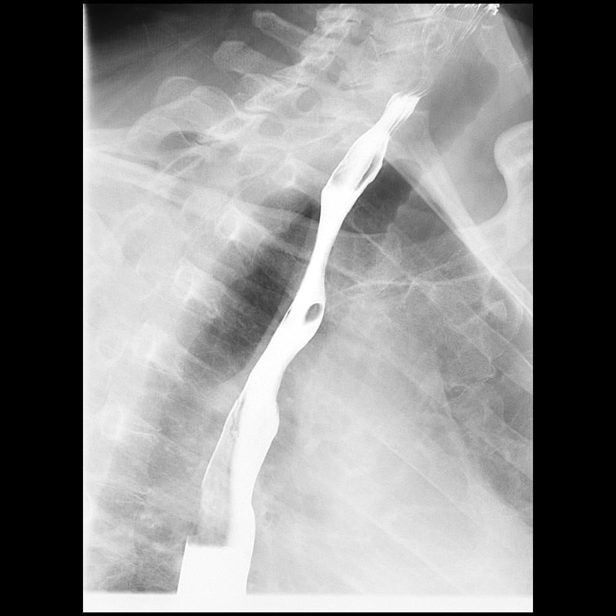

[Series 5: fluoro_barium 2fps_bw · 0.17mm/px · 1 of 1 slices shown (4 of 11)]
[im 1/1]
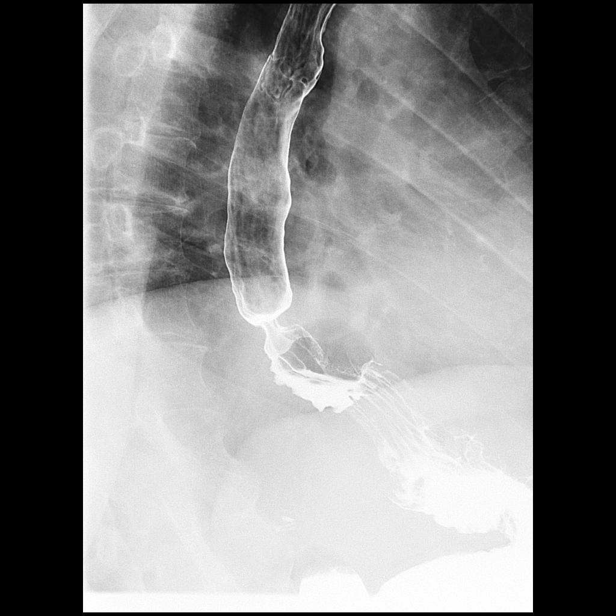

[Series 6: fluoro_barium 2fps_bw · 0.18mm/px · 1 of 1 slices shown (5 of 11)]
[im 1/1]
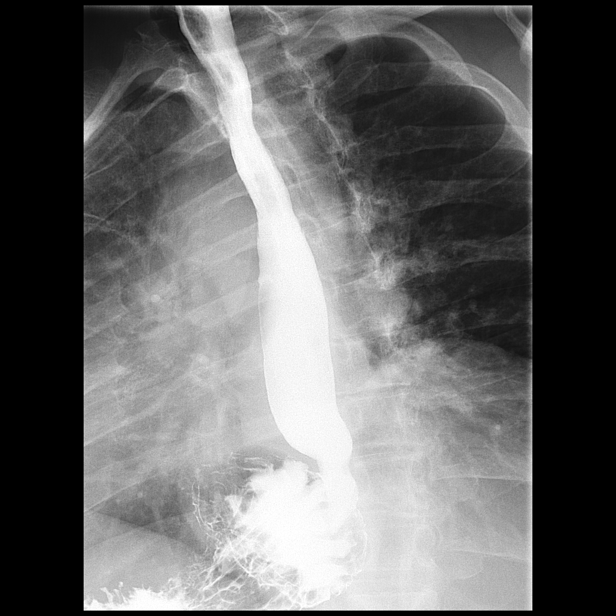

[Series 7: fluoro_barium 2fps_bw · 0.18mm/px · 1 of 1 slices shown (6 of 11)]
[im 1/1]
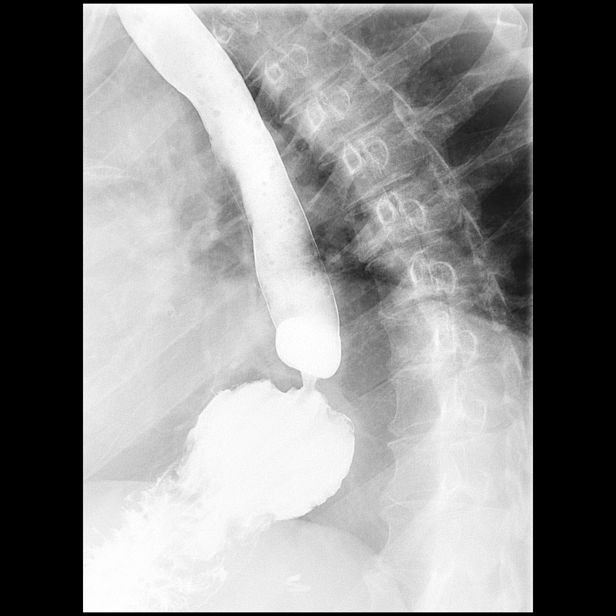

[Series 8: fluoro_barium 2fps_bw · 0.18mm/px · 1 of 1 slices shown (7 of 11)]
[im 1/1]
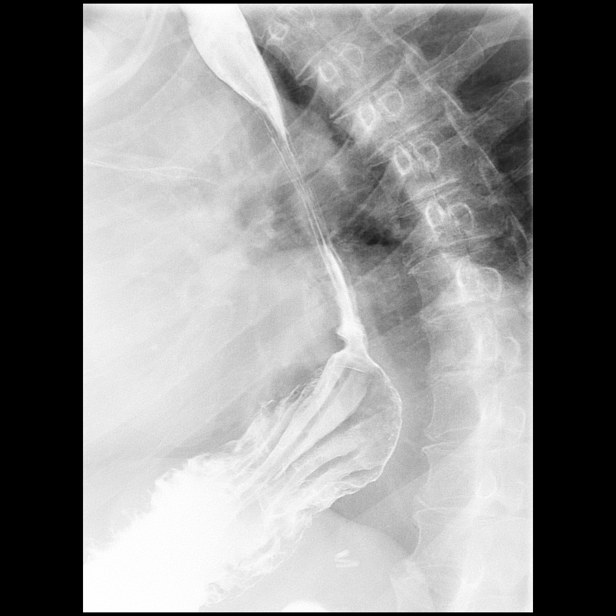

[Series 9: fluoro_barium 2fps_bw · 0.18mm/px · 1 of 1 slices shown (8 of 11)]
[im 1/1]
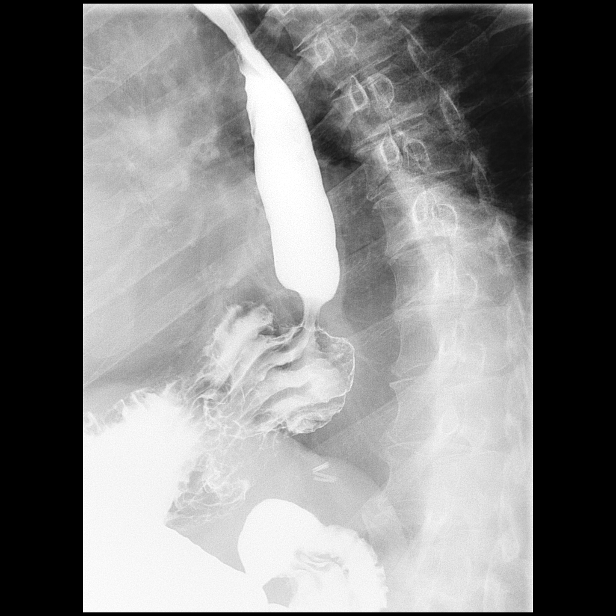

[Series 10: fluoro_barium 2fps_bw · 0.18mm/px · 1 of 1 slices shown (9 of 11)]
[im 1/1]
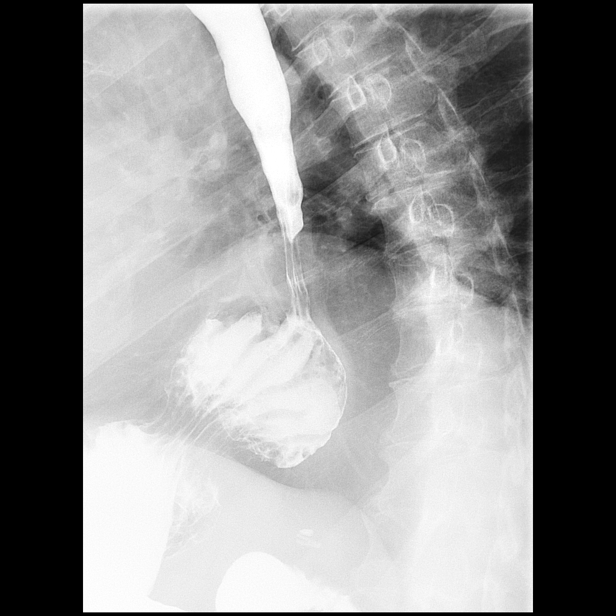

[Series 11: fluoro_barium 2fps_bw · 0.18mm/px · 1 of 1 slices shown (10 of 11)]
[im 1/1]
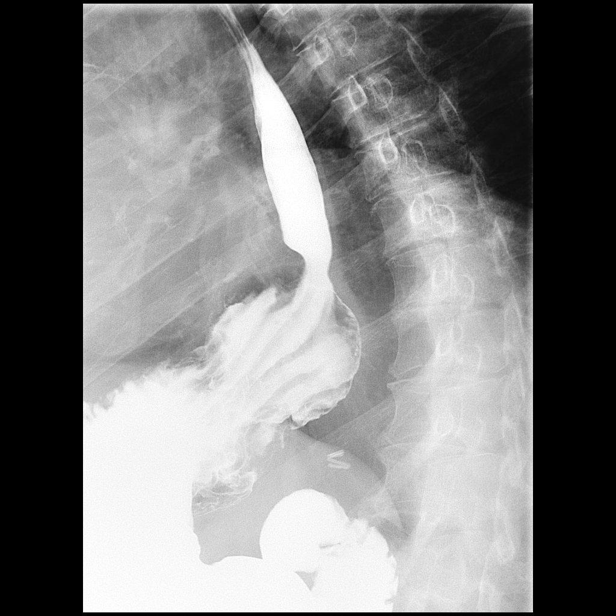

[Series 12: fluoro_barium 2fps_bw · 0.18mm/px · 1 of 1 slices shown (11 of 11)]
[im 1/1]
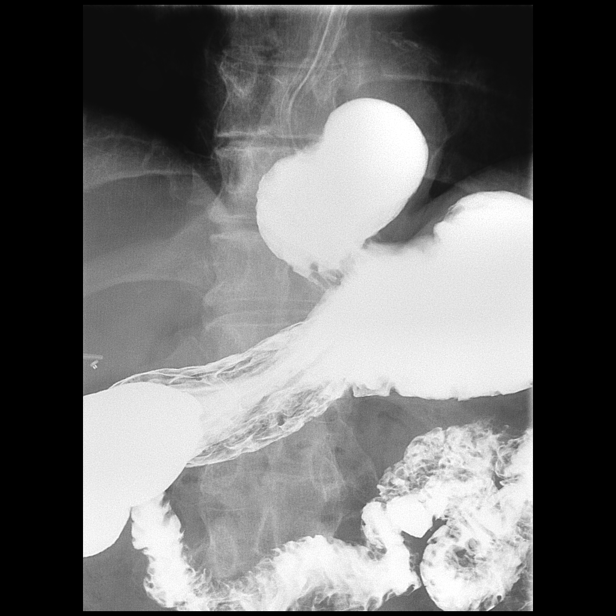

[15 of 16 positions shown; findings below may reference images not displayed]

FINDINGS: Cervical esophagus appears normal. No aspiration. Normal thoracic
esophageal peristalsis. Minimal pliable narrowing noted of the
distal esophagus at the gastroesophageal junction. Prominent hiatal
hernia. No evidence of esophageal obstruction. Minimal reflux noted.
IMPRESSION: Prominent hiatal hernia.  Minimal reflux noted.

## 2022-08-28 DIAGNOSIS — K219 Gastro-esophageal reflux disease without esophagitis: Secondary | ICD-10-CM | POA: Diagnosis not present

## 2022-08-28 DIAGNOSIS — Z86010 Personal history of colonic polyps: Secondary | ICD-10-CM | POA: Diagnosis not present

## 2022-08-28 DIAGNOSIS — K449 Diaphragmatic hernia without obstruction or gangrene: Secondary | ICD-10-CM | POA: Diagnosis not present

## 2022-08-28 DIAGNOSIS — K2270 Barrett's esophagus without dysplasia: Secondary | ICD-10-CM | POA: Diagnosis not present

## 2022-09-22 DIAGNOSIS — I2510 Atherosclerotic heart disease of native coronary artery without angina pectoris: Secondary | ICD-10-CM | POA: Diagnosis not present

## 2022-09-22 DIAGNOSIS — I10 Essential (primary) hypertension: Secondary | ICD-10-CM | POA: Diagnosis not present

## 2022-09-22 DIAGNOSIS — K219 Gastro-esophageal reflux disease without esophagitis: Secondary | ICD-10-CM | POA: Diagnosis not present

## 2022-09-22 DIAGNOSIS — Z125 Encounter for screening for malignant neoplasm of prostate: Secondary | ICD-10-CM | POA: Diagnosis not present

## 2022-09-22 DIAGNOSIS — G4733 Obstructive sleep apnea (adult) (pediatric): Secondary | ICD-10-CM | POA: Diagnosis not present

## 2022-09-22 DIAGNOSIS — E785 Hyperlipidemia, unspecified: Secondary | ICD-10-CM | POA: Diagnosis not present

## 2022-09-28 HISTORY — PX: OTHER SURGICAL HISTORY: SHX169

## 2022-09-28 HISTORY — PX: COLONOSCOPY: SHX5424

## 2022-09-29 DIAGNOSIS — R972 Elevated prostate specific antigen [PSA]: Secondary | ICD-10-CM | POA: Diagnosis not present

## 2022-09-29 DIAGNOSIS — G4733 Obstructive sleep apnea (adult) (pediatric): Secondary | ICD-10-CM | POA: Diagnosis not present

## 2022-09-29 DIAGNOSIS — I2510 Atherosclerotic heart disease of native coronary artery without angina pectoris: Secondary | ICD-10-CM | POA: Diagnosis not present

## 2022-09-29 DIAGNOSIS — K219 Gastro-esophageal reflux disease without esophagitis: Secondary | ICD-10-CM | POA: Diagnosis not present

## 2022-09-29 DIAGNOSIS — E785 Hyperlipidemia, unspecified: Secondary | ICD-10-CM | POA: Diagnosis not present

## 2022-09-29 DIAGNOSIS — Z0000 Encounter for general adult medical examination without abnormal findings: Secondary | ICD-10-CM | POA: Diagnosis not present

## 2022-09-29 DIAGNOSIS — T466X5A Adverse effect of antihyperlipidemic and antiarteriosclerotic drugs, initial encounter: Secondary | ICD-10-CM | POA: Diagnosis not present

## 2022-09-29 DIAGNOSIS — I10 Essential (primary) hypertension: Secondary | ICD-10-CM | POA: Diagnosis not present

## 2022-09-29 DIAGNOSIS — G720 Drug-induced myopathy: Secondary | ICD-10-CM | POA: Diagnosis not present

## 2022-10-09 ENCOUNTER — Ambulatory Visit: Payer: PPO

## 2022-10-09 DIAGNOSIS — K2270 Barrett's esophagus without dysplasia: Secondary | ICD-10-CM | POA: Diagnosis not present

## 2022-10-09 DIAGNOSIS — Z09 Encounter for follow-up examination after completed treatment for conditions other than malignant neoplasm: Secondary | ICD-10-CM | POA: Diagnosis not present

## 2022-10-09 DIAGNOSIS — K640 First degree hemorrhoids: Secondary | ICD-10-CM | POA: Diagnosis not present

## 2022-10-09 DIAGNOSIS — K449 Diaphragmatic hernia without obstruction or gangrene: Secondary | ICD-10-CM | POA: Diagnosis not present

## 2022-10-09 DIAGNOSIS — K5730 Diverticulosis of large intestine without perforation or abscess without bleeding: Secondary | ICD-10-CM | POA: Diagnosis not present

## 2022-10-09 DIAGNOSIS — Z86010 Personal history of colonic polyps: Secondary | ICD-10-CM | POA: Diagnosis not present

## 2022-10-09 DIAGNOSIS — Z1211 Encounter for screening for malignant neoplasm of colon: Secondary | ICD-10-CM | POA: Diagnosis not present

## 2022-12-02 ENCOUNTER — Encounter: Payer: Self-pay | Admitting: Cardiology

## 2022-12-02 DIAGNOSIS — I10 Essential (primary) hypertension: Secondary | ICD-10-CM

## 2022-12-02 DIAGNOSIS — E785 Hyperlipidemia, unspecified: Secondary | ICD-10-CM

## 2022-12-02 NOTE — Telephone Encounter (Signed)
Would be nice to have lipids and chemistry panel to review prior to follow-up.  Bryan Lemma, MD

## 2022-12-07 ENCOUNTER — Other Ambulatory Visit: Payer: Self-pay

## 2022-12-07 DIAGNOSIS — I2510 Atherosclerotic heart disease of native coronary artery without angina pectoris: Secondary | ICD-10-CM | POA: Diagnosis not present

## 2022-12-07 DIAGNOSIS — Z9861 Coronary angioplasty status: Secondary | ICD-10-CM | POA: Diagnosis not present

## 2022-12-08 LAB — COMPREHENSIVE METABOLIC PANEL
ALT: 35 IU/L (ref 0–44)
AST: 26 IU/L (ref 0–40)
Albumin: 4.3 g/dL (ref 3.9–4.9)
Alkaline Phosphatase: 54 IU/L (ref 44–121)
BUN/Creatinine Ratio: 15 (ref 10–24)
BUN: 13 mg/dL (ref 8–27)
Bilirubin Total: 0.5 mg/dL (ref 0.0–1.2)
CO2: 24 mmol/L (ref 20–29)
Calcium: 9.5 mg/dL (ref 8.6–10.2)
Chloride: 104 mmol/L (ref 96–106)
Creatinine, Ser: 0.89 mg/dL (ref 0.76–1.27)
Globulin, Total: 2.3 g/dL (ref 1.5–4.5)
Glucose: 92 mg/dL (ref 70–99)
Potassium: 3.8 mmol/L (ref 3.5–5.2)
Sodium: 141 mmol/L (ref 134–144)
Total Protein: 6.6 g/dL (ref 6.0–8.5)
eGFR: 95 mL/min/{1.73_m2} (ref >59)

## 2022-12-16 ENCOUNTER — Other Ambulatory Visit
Admission: RE | Admit: 2022-12-16 | Discharge: 2022-12-16 | Disposition: A | Discharge status: Home / Self Care | Payer: PPO | Attending: Cardiology | Admitting: Cardiology

## 2022-12-16 DIAGNOSIS — Z9861 Coronary angioplasty status: Secondary | ICD-10-CM | POA: Insufficient documentation

## 2022-12-16 DIAGNOSIS — I2510 Atherosclerotic heart disease of native coronary artery without angina pectoris: Secondary | ICD-10-CM | POA: Insufficient documentation

## 2022-12-16 LAB — COMPREHENSIVE METABOLIC PANEL WITH GFR
ALT: 47 U/L — ABNORMAL HIGH (ref 0–44)
AST: 34 U/L (ref 15–41)
Albumin: 4.3 g/dL (ref 3.5–5.0)
Alkaline Phosphatase: 52 U/L (ref 38–126)
Anion gap: 12 (ref 5–15)
BUN: 14 mg/dL (ref 8–23)
CO2: 26 mmol/L (ref 22–32)
Calcium: 9.4 mg/dL (ref 8.9–10.3)
Chloride: 102 mmol/L (ref 98–111)
Creatinine, Ser: 0.95 mg/dL (ref 0.61–1.24)
GFR, Estimated: >60 mL/min (ref >60)
Glucose, Bld: 99 mg/dL (ref 70–99)
Potassium: 3.7 mmol/L (ref 3.5–5.1)
Sodium: 140 mmol/L (ref 135–145)
Total Bilirubin: 1 mg/dL (ref 0.3–1.2)
Total Protein: 7.7 g/dL (ref 6.5–8.1)

## 2022-12-16 LAB — LIPID PANEL
Cholesterol: 137 mg/dL (ref 0–200)
HDL: 46 mg/dL (ref >40)
LDL Cholesterol: 51 mg/dL (ref 0–99)
Total CHOL/HDL Ratio: 3 ratio
Triglycerides: 200 mg/dL — ABNORMAL HIGH (ref <150)
VLDL: 40 mg/dL (ref 0–40)

## 2022-12-17 ENCOUNTER — Encounter: Payer: Self-pay | Admitting: Cardiology

## 2022-12-17 ENCOUNTER — Ambulatory Visit: Payer: PPO | Attending: Cardiology | Admitting: Cardiology

## 2022-12-17 VITALS — BP 138/88 | HR 59 | Ht 66.0 in | Wt 184.4 lb

## 2022-12-17 DIAGNOSIS — T466X5D Adverse effect of antihyperlipidemic and antiarteriosclerotic drugs, subsequent encounter: Secondary | ICD-10-CM

## 2022-12-17 DIAGNOSIS — E785 Hyperlipidemia, unspecified: Secondary | ICD-10-CM

## 2022-12-17 DIAGNOSIS — M791 Myalgia, unspecified site: Secondary | ICD-10-CM | POA: Diagnosis not present

## 2022-12-17 DIAGNOSIS — T466X5A Adverse effect of antihyperlipidemic and antiarteriosclerotic drugs, initial encounter: Secondary | ICD-10-CM

## 2022-12-17 DIAGNOSIS — I2510 Atherosclerotic heart disease of native coronary artery without angina pectoris: Secondary | ICD-10-CM | POA: Diagnosis not present

## 2022-12-17 DIAGNOSIS — Z9861 Coronary angioplasty status: Secondary | ICD-10-CM

## 2022-12-17 DIAGNOSIS — I10 Essential (primary) hypertension: Secondary | ICD-10-CM

## 2022-12-17 NOTE — Assessment & Plan Note (Signed)
3 main statins attempted in the past but unable to tolerate even low doses of simvastatin, atorvastatin and rosuvastatin as well as gemfibrozil.  Tolerating Repatha.  Excellent lipid control.

## 2022-12-17 NOTE — Assessment & Plan Note (Signed)
Since starting Repatha, excellent lipid control.  LDLs have been anywhere from mid 30s to low 50s.  Well within goal.  Plan: Continue Lovaza for high triglycerides Continue Repatha

## 2022-12-17 NOTE — Assessment & Plan Note (Signed)
PCP just darted low-dose amlodipine and his pressures are still little bit borderline today.  Would monitor closely.  At follow-up visit if pressures are still averaging over 135 systolic and diastolics closer to 90 I would probably increase amlodipine up to 5 mg. We also have room to titrate the valsartan portion of valsartan-HCTZ up to the max dose of 320 mg.Marland Kitchen

## 2022-12-17 NOTE — Progress Notes (Addendum)
 Cardiology Office Note:  .   Date:  12/17/2022  ID:  DILEN GIANGRASSO, DOB 1956/02/24, MRN [id number] PCP: Lynnea Ferrier, MD  [facility] Providers Cardiologist:  None     Chief Complaint  Patient presents with   Follow-up    12 mo follow up. Patient is feeling well. Medications reviewed verbally.    Coronary Artery Disease    Distant PCI to the circumflex in 2007.  Negative Myoview in 2023.    History of Present Illness: .     KATIE SCHOWALTER is a  67 y.o. male former patient of Dr. Lady Gary with a PMH notable for CAD-LCx PCI (2007), HTN HLD who presents here for annual f/u at the request of Lynnea Ferrier, MD.  Rhea Bleacher was last seen on December 11, 2021 as a f/u for CP - discuss results of Myoview (see below).  At that time he was doing pretty well with no major issues.  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  Denied any angina or heart failure symptoms.  No syncope or near syncope.  We started him on Repatha as he has had intolerance of simvastatin, rosuvastatin, atorvastatin and gemfibrozil. => He has had labs checked twice since last visit.    Subjective   INTERVAL HISTORY MAYCON GRAP returns today - overall feeling well.  No major issues..  Still does 45 min - 1 hour of exercise most days - strength & conditioning.   Monitors HR.  Also mows lawns with push mower.  Some dust exposure - thinks maybe some allergies.  No active CV issues. Has mild congestion/ URI Sx today.   Occasional orthostatic dizziness - short lived.   PCP added low dose amlodipine for BP -- BP now @ home averages 125-130/85-90  Cardiovascular ROS: no chest pain or dyspnea on exertion positive for - mld occasional orthostatic dizziness negative for - edema, irregular heartbeat, orthopnea, palpitations, paroxysmal nocturnal dyspnea, rapid heart rate, shortness of breath, or syncoe/ near syncope, TIA/amaurosis fugax, claudication.;   Melena, hematochezia, hematuria or epistaxis   ROS:  Review of Systems - Negative except mild URI Sx today ; no longer have any myalgias or arthralgias.     Objective   Studies Reviewed: Marland Kitchen        Myoview 10/28/2021:: No ischemia infarction.  EF 55 to 60%.  PVCs noted prestress occasionally during stress and recovery.  Circumflex stent noted.  Mild aortic atherosclerosis.  Hiatal hernia noted.  Risk.  Ref Range & Units 09/22/2022  Cholesterol, Total 100 - 200 mg/dL 409  Triglyceride 35 - 199 mg/dL 811 High   HDL (High Density Lipoprotein) Cholesterol 29.0 - 71.0 mg/dL 91.4  LDL Calculated 0 - 130 mg/dL 37   Lab Results  Component Value Date   CHOL 137 12/16/2022   HDL 46 12/16/2022   LDLCALC 51 12/16/2022   TRIG 200 (H) 12/16/2022   CHOLHDL 3.0 12/16/2022  - has a piece of pie a day prior to labs.   Lab Results  Component Value Date   NA 140 12/16/2022   CL 102 12/16/2022   K 3.7 12/16/2022   CO2 26 12/16/2022   BUN 14 12/16/2022   CREATININE 0.95 12/16/2022   GFRNONAA >60 12/16/2022   CALCIUM 9.4 12/16/2022   ALBUMIN 4.3 12/16/2022   GLUCOSE 99 12/16/2022     Risk Assessment/Calculations:  Physical Exam:   VS:  BP 138/88 (BP Location: Left Arm, Patient Position: Supine, Cuff Size: Normal)   Pulse (!) 59   Ht 5\' 6"  (1.676 m)   Wt 184 lb 6.4 oz (83.6 kg)   SpO2 98%   BMI 29.76 kg/m    Wt Readings from Last 3 Encounters:  12/17/22 184 lb 6.4 oz (83.6 kg)  12/11/21 185 lb (83.9 kg)  10/23/21 184 lb 3.2 oz (83.6 kg)    GEN: Well nourished, well developed in no acute distress; wearing mask b/c of  URI  NECK: No JVD; No carotid bruits CARDIAC: Normal S1, S2; RRR, no murmurs, rubs, gallops RESPIRATORY:  Clear to auscultation without rales, wheezing or rhonchi ; nonlabored, good air movement. ABDOMEN: Soft, non-tender, non-distended EXTREMITIES:  No edema; No deformity      ASSESSMENT AND PLAN: .    Problem List Items Addressed This Visit        Cardiology Problems   CAD S/P percutaneous coronary angioplasty - Primary (Chronic)    Distant history of circumflex PCI now with negative Myoview as of 2023.  Doing well.  Very active with no anginal heart failure symptoms.  Plan: Continue maintenance dose of aspirin-but okay to hold for procedures or surgeries. Doing well with Repatha with lipids excellent with exception of mild hypertriglyceridemia. Discussed dietary modification to avoid high starchy foods etc.. Continue Lovaza Not on beta-blocker because of bradycardia but is on low-dose amlodipine started by PCP along with valsartan and HCTZ with still somewhat lower blood pressures.  Low threshold to titrate amlodipine further.      Relevant Medications   amLODipine (NORVASC) 2.5 MG tablet   Other Relevant Orders   EKG 12-Lead   Essential hypertension (Chronic)    PCP just darted low-dose amlodipine and his pressures are still little bit borderline today.  Would monitor closely.  At follow-up visit if pressures are still averaging over 135 systolic and diastolics closer to 90 I would probably increase amlodipine up to 5 mg. We also have room to titrate the valsartan portion of valsartan-HCTZ up to the max dose of 320 mg..      Relevant Medications   amLODipine (NORVASC) 2.5 MG tablet   Hyperlipidemia LDL goal <70 (Chronic)    Since starting Repatha, excellent lipid control.  LDLs have been anywhere from mid 30s to low 50s.  Well within goal.  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  Tolerating Repatha.  Excellent lipid control.               Dispo: Return in about 1 year (around 12/17/2023) for 1 Yr Follow-up, Follow-up in  [place].  Total time spent: 21 min spent with patient + 12 min spent charting = 33 min     Signed, Marykay Lex, MD, MS Bryan Lemma, M.D., M.S. Interventional Cardiologist  [facility]  Pager # [phone] Phone # [phone] [address] [place], [place] [address]

## 2022-12-17 NOTE — Assessment & Plan Note (Signed)
Distant history of circumflex PCI now with negative Myoview as of 2023.  Doing well.  Very active with no anginal heart failure symptoms.  Plan: Continue maintenance dose of aspirin-but okay to hold for procedures or surgeries. Doing well with Repatha with lipids excellent with exception of mild hypertriglyceridemia. Discussed dietary modification to avoid high starchy foods etc.. Continue Lovaza Not on beta-blocker because of bradycardia but is on low-dose amlodipine started by PCP along with valsartan and HCTZ with still somewhat lower blood pressures.  Low threshold to titrate amlodipine further.

## 2022-12-17 NOTE — Patient Instructions (Signed)
Medication Instructions:  - Your physician recommends that you continue on your current medications as directed. Please refer to the Current Medication list given to you today.  *If you need a refill on your cardiac medications before your next appointment, please call your pharmacy*   Lab Work: - none ordered  If you have labs (blood work) drawn today and your tests are completely normal, you will receive your results only by: MyChart Message (if you have MyChart) OR A paper copy in the mail If you have any lab test that is abnormal or we need to change your treatment, we will call you to review the results.   Testing/Procedures: - none ordered   Follow-Up: At [facility], you and your health needs are our priority.  As part of our continuing mission to provide you with exceptional heart care, we have created designated Provider Care Teams.  These Care Teams include your primary Cardiologist (physician) and Advanced Practice Providers (APPs -  Physician Assistants and Nurse Practitioners) who all work together to provide you with the care you need, when you need it.  We recommend signing up for the patient portal called "MyChart".  Sign up information is provided on this After Visit Summary.  MyChart is used to connect with patients for Virtual Visits (Telemedicine).  Patients are able to view lab/test results, encounter notes, upcoming appointments, etc.  Non-urgent messages can be sent to your provider as well.   To learn more about what you can do with MyChart, go to [email or web address].    Your next appointment:   1 year(s)  Provider:   You may see Bryan Lemma, MD or one of the following Advanced Practice Providers on your designated Care Team:   Nicolasa Ducking, NP Eula Listen, PA-C Cadence Fransico Michael, PA-C Charlsie Quest, NP    Other Instructions N/a

## 2023-01-07 DIAGNOSIS — L570 Actinic keratosis: Secondary | ICD-10-CM | POA: Diagnosis not present

## 2023-01-07 DIAGNOSIS — L821 Other seborrheic keratosis: Secondary | ICD-10-CM | POA: Diagnosis not present

## 2023-01-07 DIAGNOSIS — D225 Melanocytic nevi of trunk: Secondary | ICD-10-CM | POA: Diagnosis not present

## 2023-01-07 DIAGNOSIS — D2271 Melanocytic nevi of right lower limb, including hip: Secondary | ICD-10-CM | POA: Diagnosis not present

## 2023-01-07 DIAGNOSIS — D2272 Melanocytic nevi of left lower limb, including hip: Secondary | ICD-10-CM | POA: Diagnosis not present

## 2023-01-07 DIAGNOSIS — D2262 Melanocytic nevi of left upper limb, including shoulder: Secondary | ICD-10-CM | POA: Diagnosis not present

## 2023-01-07 DIAGNOSIS — D2261 Melanocytic nevi of right upper limb, including shoulder: Secondary | ICD-10-CM | POA: Diagnosis not present

## 2023-02-16 ENCOUNTER — Other Ambulatory Visit: Payer: Self-pay

## 2023-02-16 MED ORDER — VALSARTAN-HYDROCHLOROTHIAZIDE 160-25 MG PO TABS
0.5000 | ORAL_TABLET | Freq: Two times a day (BID) | ORAL | 3 refills | Status: DC
  Filled 2023-08-03: qty 90, 90d supply, fill #0

## 2023-02-16 MED ORDER — PANTOPRAZOLE SODIUM 40 MG PO TBEC
40.0000 mg | DELAYED_RELEASE_TABLET | Freq: Two times a day (BID) | ORAL | 3 refills | Status: AC
  Filled 2023-02-23 – 2023-08-03 (×2): qty 180, 90d supply, fill #0

## 2023-02-16 MED ORDER — LEVOTHYROXINE SODIUM 25 MCG PO TABS
25.0000 ug | ORAL_TABLET | Freq: Every day | ORAL | 3 refills | Status: DC
  Filled 2023-02-23: qty 90, 90d supply, fill #0
  Filled 2023-05-20: qty 90, 90d supply, fill #1

## 2023-02-18 ENCOUNTER — Other Ambulatory Visit: Payer: Self-pay

## 2023-02-23 ENCOUNTER — Other Ambulatory Visit: Payer: Self-pay

## 2023-03-19 ENCOUNTER — Other Ambulatory Visit: Payer: Self-pay

## 2023-03-19 MED ORDER — COMIRNATY 30 MCG/0.3ML IM SUSY
0.3000 mL | PREFILLED_SYRINGE | Freq: Once | INTRAMUSCULAR | 0 refills | Status: AC
  Filled 2023-03-19: qty 0.3, 1d supply, fill #0

## 2023-03-19 MED ORDER — FLUAD 0.5 ML IM SUSY
0.5000 mL | PREFILLED_SYRINGE | Freq: Once | INTRAMUSCULAR | 0 refills | Status: AC
  Filled 2023-03-19: qty 0.5, 1d supply, fill #0

## 2023-03-30 DIAGNOSIS — E785 Hyperlipidemia, unspecified: Secondary | ICD-10-CM | POA: Diagnosis not present

## 2023-03-30 DIAGNOSIS — R972 Elevated prostate specific antigen [PSA]: Secondary | ICD-10-CM | POA: Diagnosis not present

## 2023-03-30 DIAGNOSIS — I2510 Atherosclerotic heart disease of native coronary artery without angina pectoris: Secondary | ICD-10-CM | POA: Diagnosis not present

## 2023-03-30 DIAGNOSIS — K219 Gastro-esophageal reflux disease without esophagitis: Secondary | ICD-10-CM | POA: Diagnosis not present

## 2023-03-30 DIAGNOSIS — G4733 Obstructive sleep apnea (adult) (pediatric): Secondary | ICD-10-CM | POA: Diagnosis not present

## 2023-03-30 DIAGNOSIS — I10 Essential (primary) hypertension: Secondary | ICD-10-CM | POA: Diagnosis not present

## 2023-04-06 ENCOUNTER — Other Ambulatory Visit: Payer: Self-pay

## 2023-04-06 DIAGNOSIS — G4733 Obstructive sleep apnea (adult) (pediatric): Secondary | ICD-10-CM | POA: Diagnosis not present

## 2023-04-06 DIAGNOSIS — I10 Essential (primary) hypertension: Secondary | ICD-10-CM | POA: Diagnosis not present

## 2023-04-06 DIAGNOSIS — E785 Hyperlipidemia, unspecified: Secondary | ICD-10-CM | POA: Diagnosis not present

## 2023-04-06 DIAGNOSIS — Z131 Encounter for screening for diabetes mellitus: Secondary | ICD-10-CM | POA: Diagnosis not present

## 2023-04-06 DIAGNOSIS — G8929 Other chronic pain: Secondary | ICD-10-CM | POA: Diagnosis not present

## 2023-04-06 DIAGNOSIS — T466X5A Adverse effect of antihyperlipidemic and antiarteriosclerotic drugs, initial encounter: Secondary | ICD-10-CM | POA: Diagnosis not present

## 2023-04-06 DIAGNOSIS — I2510 Atherosclerotic heart disease of native coronary artery without angina pectoris: Secondary | ICD-10-CM | POA: Diagnosis not present

## 2023-04-06 DIAGNOSIS — M79672 Pain in left foot: Secondary | ICD-10-CM | POA: Diagnosis not present

## 2023-04-06 DIAGNOSIS — G720 Drug-induced myopathy: Secondary | ICD-10-CM | POA: Diagnosis not present

## 2023-04-06 DIAGNOSIS — Z125 Encounter for screening for malignant neoplasm of prostate: Secondary | ICD-10-CM | POA: Diagnosis not present

## 2023-04-06 DIAGNOSIS — K219 Gastro-esophageal reflux disease without esophagitis: Secondary | ICD-10-CM | POA: Diagnosis not present

## 2023-04-13 ENCOUNTER — Other Ambulatory Visit: Payer: Self-pay

## 2023-04-13 DIAGNOSIS — M722 Plantar fascial fibromatosis: Secondary | ICD-10-CM | POA: Diagnosis not present

## 2023-04-13 DIAGNOSIS — M7662 Achilles tendinitis, left leg: Secondary | ICD-10-CM | POA: Diagnosis not present

## 2023-04-13 DIAGNOSIS — M79672 Pain in left foot: Secondary | ICD-10-CM | POA: Diagnosis not present

## 2023-04-13 DIAGNOSIS — I2510 Atherosclerotic heart disease of native coronary artery without angina pectoris: Secondary | ICD-10-CM | POA: Diagnosis not present

## 2023-04-13 MED ORDER — CELECOXIB 200 MG PO CAPS
200.0000 mg | ORAL_CAPSULE | Freq: Every day | ORAL | 0 refills | Status: DC
  Filled 2023-04-13: qty 30, 30d supply, fill #0

## 2023-04-14 ENCOUNTER — Other Ambulatory Visit: Payer: Self-pay

## 2023-04-14 MED ORDER — MAGNESIUM OXIDE 400 MG PO TABS
400.0000 mg | ORAL_TABLET | Freq: Every day | ORAL | 3 refills | Status: DC
  Filled 2023-04-14: qty 90, 90d supply, fill #0
  Filled 2023-07-10: qty 90, 90d supply, fill #1

## 2023-04-14 MED ORDER — VALSARTAN-HYDROCHLOROTHIAZIDE 160-25 MG PO TABS
0.5000 | ORAL_TABLET | Freq: Two times a day (BID) | ORAL | 3 refills | Status: AC
  Filled 2023-04-14 – 2023-04-29 (×2): qty 90, 90d supply, fill #0
  Filled 2023-10-30: qty 90, 90d supply, fill #1

## 2023-04-14 MED ORDER — PANTOPRAZOLE SODIUM 40 MG PO TBEC
40.0000 mg | DELAYED_RELEASE_TABLET | Freq: Two times a day (BID) | ORAL | 3 refills | Status: DC
  Filled 2023-04-14 – 2023-04-29 (×2): qty 180, 90d supply, fill #0
  Filled 2023-10-30: qty 180, 90d supply, fill #1

## 2023-04-14 MED ORDER — AMLODIPINE BESYLATE 2.5 MG PO TABS
2.5000 mg | ORAL_TABLET | Freq: Every day | ORAL | 3 refills | Status: DC
  Filled 2023-04-14: qty 90, 90d supply, fill #0
  Filled 2023-07-10: qty 90, 90d supply, fill #1

## 2023-04-14 MED ORDER — EPINEPHRINE 0.3 MG/0.3ML IJ SOAJ
0.3000 mg | INTRAMUSCULAR | 1 refills | Status: DC | PRN
  Filled 2023-04-14 – 2023-07-10 (×2): qty 2, 30d supply, fill #0
  Filled 2023-11-08: qty 2, 1d supply, fill #0

## 2023-04-15 ENCOUNTER — Encounter: Payer: Self-pay | Admitting: Cardiology

## 2023-04-16 ENCOUNTER — Telehealth: Payer: Self-pay | Admitting: Cardiology

## 2023-04-16 ENCOUNTER — Telehealth: Payer: Self-pay | Admitting: Pharmacy Technician

## 2023-04-16 ENCOUNTER — Other Ambulatory Visit (HOSPITAL_COMMUNITY): Payer: Self-pay

## 2023-04-16 NOTE — Telephone Encounter (Signed)
[employer] calling to get additional information requesting cb  Evolocumab (REPATHA SURECLICK) 140 MG/ML SOAJ

## 2023-04-16 NOTE — Telephone Encounter (Signed)
PA request has been Submitted. New Encounter created for follow up. For additional info see Pharmacy Prior Auth telephone encounter from 04/16/23.

## 2023-04-16 NOTE — Telephone Encounter (Signed)
Pharmacy Patient Advocate Encounter   Received notification from Pt Calls Messages that prior authorization for Repatha 140mg /ml is required/requested.   Insurance verification completed.   The patient is insured through [employer] ADVANT[employer] ADVANCE .   Per test claim: PA required; PA submitted to above mentioned insurance via Phone Key/confirmation #/EOC  req id: [id number] Status is pending

## 2023-04-19 ENCOUNTER — Other Ambulatory Visit (HOSPITAL_COMMUNITY): Payer: Self-pay

## 2023-04-19 ENCOUNTER — Telehealth: Payer: Self-pay | Admitting: Pharmacy Technician

## 2023-04-19 NOTE — Telephone Encounter (Signed)
Tried to run to see if approved and it says too soon until 04/27/23 last filled 04/06/23

## 2023-04-19 NOTE — Telephone Encounter (Signed)
Tried to run to see if approved and it says too soon until 04/27/23 last filled 04/06/23. Will check on the 28th to see if pa is approved

## 2023-04-27 ENCOUNTER — Other Ambulatory Visit (HOSPITAL_COMMUNITY): Payer: Self-pay

## 2023-04-27 ENCOUNTER — Other Ambulatory Visit: Payer: Self-pay

## 2023-04-27 NOTE — Telephone Encounter (Signed)
Per test claim: The current 28 day co-pay is, $47.00 [facility]

## 2023-04-27 NOTE — Telephone Encounter (Signed)
Called patient, LVM advising of information below for medication. Left call back number for questions/concerns.

## 2023-04-30 ENCOUNTER — Other Ambulatory Visit: Payer: Self-pay

## 2023-05-04 DIAGNOSIS — M7662 Achilles tendinitis, left leg: Secondary | ICD-10-CM | POA: Diagnosis not present

## 2023-05-04 DIAGNOSIS — M79672 Pain in left foot: Secondary | ICD-10-CM | POA: Diagnosis not present

## 2023-05-04 DIAGNOSIS — M722 Plantar fascial fibromatosis: Secondary | ICD-10-CM | POA: Diagnosis not present

## 2023-05-23 ENCOUNTER — Other Ambulatory Visit: Payer: Self-pay | Admitting: Cardiology

## 2023-05-24 ENCOUNTER — Other Ambulatory Visit: Payer: Self-pay

## 2023-05-24 MED ORDER — REPATHA SURECLICK 140 MG/ML ~~LOC~~ SOAJ
140.0000 mg | SUBCUTANEOUS | 3 refills | Status: DC
  Filled 2023-05-24: qty 6, 84d supply, fill #0
  Filled 2023-08-15: qty 6, 84d supply, fill #1
  Filled 2023-11-05: qty 6, 84d supply, fill #2
  Filled 2024-01-31: qty 6, 84d supply, fill #3

## 2023-07-11 ENCOUNTER — Other Ambulatory Visit: Payer: Self-pay

## 2023-07-11 MED ORDER — EPINEPHRINE 0.3 MG/0.3ML IJ SOAJ
0.3000 mg | INTRAMUSCULAR | 1 refills | Status: DC | PRN
  Filled 2023-07-11: qty 2, 30d supply, fill #0
  Filled 2023-11-07: qty 2, 30d supply, fill #1

## 2023-07-11 MED ORDER — AMLODIPINE BESYLATE 2.5 MG PO TABS
2.5000 mg | ORAL_TABLET | Freq: Every day | ORAL | 3 refills | Status: DC
Start: 2023-07-11 — End: 2023-12-23
  Filled 2023-07-11 – 2023-08-03 (×2): qty 90, 90d supply, fill #0

## 2023-07-11 MED ORDER — MAGNESIUM OXIDE 400 MG PO TABS
400.0000 mg | ORAL_TABLET | Freq: Every day | ORAL | 3 refills | Status: DC
  Filled 2023-07-11: qty 90, 90d supply, fill #0

## 2023-07-12 ENCOUNTER — Other Ambulatory Visit: Payer: Self-pay

## 2023-08-03 ENCOUNTER — Other Ambulatory Visit: Payer: Self-pay

## 2023-08-16 ENCOUNTER — Other Ambulatory Visit: Payer: Self-pay

## 2023-08-16 MED ORDER — LEVOTHYROXINE SODIUM 25 MCG PO TABS
25.0000 ug | ORAL_TABLET | Freq: Every day | ORAL | 3 refills | Status: AC
  Filled 2023-08-16: qty 90, 90d supply, fill #0

## 2023-09-23 DIAGNOSIS — G4733 Obstructive sleep apnea (adult) (pediatric): Secondary | ICD-10-CM | POA: Diagnosis not present

## 2023-09-23 DIAGNOSIS — I10 Essential (primary) hypertension: Secondary | ICD-10-CM | POA: Diagnosis not present

## 2023-09-23 DIAGNOSIS — Z131 Encounter for screening for diabetes mellitus: Secondary | ICD-10-CM | POA: Diagnosis not present

## 2023-09-23 DIAGNOSIS — I2510 Atherosclerotic heart disease of native coronary artery without angina pectoris: Secondary | ICD-10-CM | POA: Diagnosis not present

## 2023-09-23 DIAGNOSIS — E785 Hyperlipidemia, unspecified: Secondary | ICD-10-CM | POA: Diagnosis not present

## 2023-09-28 DIAGNOSIS — Z125 Encounter for screening for malignant neoplasm of prostate: Secondary | ICD-10-CM | POA: Diagnosis not present

## 2023-09-28 DIAGNOSIS — E785 Hyperlipidemia, unspecified: Secondary | ICD-10-CM | POA: Diagnosis not present

## 2023-10-05 DIAGNOSIS — G4733 Obstructive sleep apnea (adult) (pediatric): Secondary | ICD-10-CM | POA: Diagnosis not present

## 2023-10-05 DIAGNOSIS — E785 Hyperlipidemia, unspecified: Secondary | ICD-10-CM | POA: Diagnosis not present

## 2023-10-05 DIAGNOSIS — R251 Tremor, unspecified: Secondary | ICD-10-CM | POA: Diagnosis not present

## 2023-10-05 DIAGNOSIS — Z1331 Encounter for screening for depression: Secondary | ICD-10-CM | POA: Diagnosis not present

## 2023-10-05 DIAGNOSIS — Z0000 Encounter for general adult medical examination without abnormal findings: Secondary | ICD-10-CM | POA: Diagnosis not present

## 2023-10-05 DIAGNOSIS — K219 Gastro-esophageal reflux disease without esophagitis: Secondary | ICD-10-CM | POA: Diagnosis not present

## 2023-10-05 DIAGNOSIS — I10 Essential (primary) hypertension: Secondary | ICD-10-CM | POA: Diagnosis not present

## 2023-10-05 DIAGNOSIS — G720 Drug-induced myopathy: Secondary | ICD-10-CM | POA: Diagnosis not present

## 2023-10-05 DIAGNOSIS — I493 Ventricular premature depolarization: Secondary | ICD-10-CM | POA: Diagnosis not present

## 2023-10-05 DIAGNOSIS — I2510 Atherosclerotic heart disease of native coronary artery without angina pectoris: Secondary | ICD-10-CM | POA: Diagnosis not present

## 2023-10-05 DIAGNOSIS — T466X5A Adverse effect of antihyperlipidemic and antiarteriosclerotic drugs, initial encounter: Secondary | ICD-10-CM | POA: Diagnosis not present

## 2023-10-10 ENCOUNTER — Other Ambulatory Visit: Payer: Self-pay

## 2023-10-10 MED ORDER — AMLODIPINE BESYLATE 2.5 MG PO TABS
2.5000 mg | ORAL_TABLET | Freq: Every day | ORAL | 3 refills | Status: DC
  Filled 2023-10-10: qty 90, 90d supply, fill #0

## 2023-10-10 MED ORDER — MAGNESIUM OXIDE 400 MG PO TABS
400.0000 mg | ORAL_TABLET | Freq: Every day | ORAL | 3 refills | Status: DC
  Filled 2023-10-10: qty 90, 90d supply, fill #0

## 2023-11-05 ENCOUNTER — Other Ambulatory Visit: Payer: Self-pay

## 2023-11-08 ENCOUNTER — Other Ambulatory Visit: Payer: Self-pay

## 2023-11-19 ENCOUNTER — Other Ambulatory Visit: Payer: Self-pay

## 2023-11-19 MED ORDER — LEVOTHYROXINE SODIUM 25 MCG PO TABS
25.0000 ug | ORAL_TABLET | Freq: Every day | ORAL | 3 refills | Status: DC
  Filled 2023-11-19: qty 90, 90d supply, fill #0

## 2023-11-22 ENCOUNTER — Other Ambulatory Visit: Payer: Self-pay

## 2023-11-22 MED ORDER — LEVOTHYROXINE SODIUM 25 MCG PO TABS
25.0000 ug | ORAL_TABLET | Freq: Every day | ORAL | 3 refills | Status: DC
  Filled 2023-11-22: qty 90, 90d supply, fill #0

## 2023-11-24 DIAGNOSIS — R251 Tremor, unspecified: Secondary | ICD-10-CM | POA: Diagnosis not present

## 2023-11-24 DIAGNOSIS — E538 Deficiency of other specified B group vitamins: Secondary | ICD-10-CM | POA: Diagnosis not present

## 2023-11-24 DIAGNOSIS — R42 Dizziness and giddiness: Secondary | ICD-10-CM | POA: Diagnosis not present

## 2023-11-24 DIAGNOSIS — Z1331 Encounter for screening for depression: Secondary | ICD-10-CM | POA: Diagnosis not present

## 2023-11-24 DIAGNOSIS — Z789 Other specified health status: Secondary | ICD-10-CM | POA: Diagnosis not present

## 2023-11-26 ENCOUNTER — Other Ambulatory Visit: Payer: Self-pay | Admitting: Neurology

## 2023-11-26 DIAGNOSIS — R42 Dizziness and giddiness: Secondary | ICD-10-CM

## 2023-11-26 DIAGNOSIS — R251 Tremor, unspecified: Secondary | ICD-10-CM

## 2023-11-30 ENCOUNTER — Ambulatory Visit
Admission: RE | Admit: 2023-11-30 | Discharge: 2023-11-30 | Disposition: A | Discharge status: Home / Self Care | Source: Ambulatory Visit | Attending: Neurology | Admitting: Neurology

## 2023-11-30 DIAGNOSIS — R251 Tremor, unspecified: Secondary | ICD-10-CM | POA: Insufficient documentation

## 2023-11-30 DIAGNOSIS — I6782 Cerebral ischemia: Secondary | ICD-10-CM | POA: Diagnosis not present

## 2023-11-30 DIAGNOSIS — R42 Dizziness and giddiness: Secondary | ICD-10-CM | POA: Diagnosis not present

## 2023-11-30 MED ORDER — GADOBUTROL 1 MMOL/ML IV SOLN
8.0000 mL | Freq: Once | INTRAVENOUS | Status: AC | PRN
  Administered 2023-11-30: 8 mL via INTRAVENOUS

## 2023-12-13 DIAGNOSIS — H2513 Age-related nuclear cataract, bilateral: Secondary | ICD-10-CM | POA: Diagnosis not present

## 2023-12-13 DIAGNOSIS — H5213 Myopia, bilateral: Secondary | ICD-10-CM | POA: Diagnosis not present

## 2023-12-23 ENCOUNTER — Ambulatory Visit: Attending: Cardiology | Admitting: Cardiology

## 2023-12-23 ENCOUNTER — Encounter: Payer: Self-pay | Admitting: Cardiology

## 2023-12-23 VITALS — BP 130/86 | HR 57 | Ht 66.0 in | Wt 185.2 lb

## 2023-12-23 DIAGNOSIS — Z9861 Coronary angioplasty status: Secondary | ICD-10-CM | POA: Diagnosis not present

## 2023-12-23 DIAGNOSIS — T466X5S Adverse effect of antihyperlipidemic and antiarteriosclerotic drugs, sequela: Secondary | ICD-10-CM | POA: Diagnosis not present

## 2023-12-23 DIAGNOSIS — I10 Essential (primary) hypertension: Secondary | ICD-10-CM | POA: Diagnosis not present

## 2023-12-23 DIAGNOSIS — E785 Hyperlipidemia, unspecified: Secondary | ICD-10-CM

## 2023-12-23 DIAGNOSIS — I2510 Atherosclerotic heart disease of native coronary artery without angina pectoris: Secondary | ICD-10-CM

## 2023-12-23 DIAGNOSIS — M791 Myalgia, unspecified site: Secondary | ICD-10-CM | POA: Diagnosis not present

## 2023-12-23 NOTE — Patient Instructions (Signed)
 Medication Instructions:   Your physician recommends that you continue on your current medications as directed. Please refer to the Current Medication list given to you today.    *If you need a refill on your cardiac medications before your next appointment, please call your pharmacy*  Lab Work:  None ordered at this time   If you have labs (blood work) drawn today and your tests are completely normal, you will receive your results only by:  MyChart Message (if you have MyChart) OR  A paper copy in the mail If you have any lab test that is abnormal or we need to change your treatment, we will call you to review the results.  Testing/Procedures:  None ordered at this time   Referrals:  None ordered at this time   Follow-Up:  At [facility], you and your health needs are our priority.  As part of our continuing mission to provide you with exceptional heart care, our providers are all part of one team.  This team includes your primary Cardiologist (physician) and Advanced Practice Providers or APPs (Physician Assistants and Nurse Practitioners) who all work together to provide you with the care you need, when you need it.  Your next appointment:   1 year(s)  Provider:    Alm Clay, MD    We recommend signing up for the patient portal called MyChart.  Sign up information is provided on this After Visit Summary.  MyChart is used to connect with patients for Virtual Visits (Telemedicine).  Patients are able to view lab/test results, encounter notes, upcoming appointments, etc.  Non-urgent messages can be sent to your provider as well.   To learn more about what you can do with MyChart, go to [email or web address].

## 2023-12-23 NOTE — Progress Notes (Signed)
 Cardiology Office Note:  .   Date:  12/30/2023  ID:  Derek Olsen, DOB 09-Aug-1955, MRN [id number] PCP: Fernande Ophelia JINNY DOUGLAS, MD  [facility] Providers Cardiologist:  None     Chief Complaint  Patient presents with   12 MONTH FOLLOW-UP    Pt some times feels son flu. *NO TAKES MEDS TODAY*   Coronary Artery Disease    No active angina.    Patient Profile: .     Derek Olsen is a 68 y.o. male with a PMH notable for CAD-LCx PCI (2007), HTN HLD who presents here for annual follow-up at the request of Fernande Ophelia JINNY DOUGLAS, MD.  Derek Olsen is a  68 y.o. male former patient of Dr. Bosie Past Medical History - Hypertension - Hyperlipidemia - Hypothyroidism - Benign essential tremor - Small vessel disease - Gastroesophageal reflux disease - Insect sting allergy (yellow jackets)  Surgical History: - Coronary artery stent placement: Stents placed at age 65      Derek Olsen was last seen on 12/17/2022:  No major issues..  Still does 45 min - 1 hour of exercise most days - strength & conditioning.   Monitors HR.  Also mows lawns with push mower.  Some dust exposure - thinks maybe some allergies.  No active CV issues. Has mild congestion/ URI Sx today.   Occasional orthostatic dizziness - short lived.  NO BB 2/2 bradycardia.   Subjective  Discussed the use of AI scribe software for clinical note transcription with the patient, who gave verbal consent to proceed.  History of Present Illness Derek Olsen is a 68 year old male with hypertension and coronary artery disease who presents for a routine follow-up.  He experiences an irritating flutter sensation upon standing, accompanied by slight dizziness, but no sensation of impending syncope. These symptoms do not occur during exercise, including weight and strength training. His resting heart rate is typically in the fifties, monitored with a heart rate tracking watch.  He has a history of coronary artery disease with  stents placed 18 years ago. No chest pain, pressure, or tightness. No shortness of breath when lying flat or waking up short of breath. No leg swelling, syncope, or stroke-like symptoms.  He mentions a tremor in his right hand that has developed over the past couple of years, affecting a couple of fingers at a time. An MRI showed some white matter changes, and the neurologist was not concerned about the findings.  His current medications include amlodipine  2.5 mg, valsartan  with hydrochlorothiazide  (half a tablet twice a day), Synthroid  25 mcg, and Protonix  40 mg twice a day. He recently started taking vitamin B12 supplements due to a low level of 269. He also uses an EpiPen  for insect stings, particularly yellow jackets, but has not had to use it recently.  His cholesterol levels have improved with Repatha , although his triglycerides tend to rise during the summer, which he attributes to dietary changes. He does not consume many soft drinks but drinks tea and eats more sandwiches during the summer months.   Cardiovascular ROS: no chest pain or dyspnea on exertion negative for - edema, irregular heartbeat, loss of consciousness, orthopnea, palpitations, paroxysmal nocturnal dyspnea, rapid heart rate, shortness of breath, or syncope or near-syncope, TIA or emesis weakness, claudication \    Objective   Current Meds CV Medications Sig   amLODipine  (NORVASC ) 2.5 MG tablet Take 2.5 mg by mouth daily.   aspirin 81 MG  tablet Take 81 mg by mouth daily.   Evolocumab  (REPATHA  SURECLICK) 140 MG/ML SOAJ Inject 140 mg into the skin every 14 (fourteen) days.   valsartan -hydrochlorothiazide  (DIOVAN -HCT) 160-25 MG tablet Take 0.5 tablets by mouth 2 (two) times daily.    Medication Sig   acetaminophen (TYLENOL) 500 MG tablet Take 500 mg by mouth every 4 (four) hours as needed.   cyanocobalamin 2000 MCG tablet Take 2,000 mcg by mouth daily.   EPINEPHrine  (EPIPEN  2-PAK) 0.3 mg/0.3 mL IJ SOAJ injection Inject  0.3 mg into the muscle as needed for anaphylaxis.   Ibuprofen (MOTRIN PO) Take by mouth at bedtime as needed.   levothyroxine  (SYNTHROID ) 25 MCG tablet Take 1 tablet (25 mcg total) by mouth daily every morning (630AM) 30-60 minutes before breakfast on an empty stomach with a glass of water.   magnesium  oxide (MAG-OX) 400 (240 Mg) MG tablet Take 1 tablet by mouth daily.   Menthol, Topical Analgesic, (ICY HOT EX) Apply topically.   Multiple Vitamin (MULTIVITAMIN) tablet Take 1 tablet by mouth daily.   omega-3 acid ethyl esters (LOVAZA) 1 g capsule Take by mouth 2 (two) times daily.   pantoprazole  (PROTONIX ) 40 MG tablet Take 1 tablet (40 mg total) by mouth 2 (two) times daily.   trolamine salicylate (ASPERCREME) 10 % cream Apply 1 application topically as needed for muscle pain.   Studies Reviewed: SABRA   EKG Interpretation Date/Time:  Thursday December 23 2023 09:18:17 EDT Ventricular Rate:  57 PR Interval:  198 QRS Duration:  142 QT Interval:  432 QTC Calculation: 420 R Axis:   40  Text Interpretation: Sinus bradycardia with sinus arrhythmia Right bundle branch block When compared with ECG of 20-Oct-2021 10:33, Premature ventricular complexes are no longer Present Right bundle branch block has replaced Incomplete right bundle branch block Confirmed by Anner Lenis ([id number]) on 12/23/2023 9:36:39 AM   Results LABS TSH: 2.75 (09/28/2023) Vitamin B12: 269 Sodium: 139 (08/2023) Potassium: 3.9 (08/2023) Bicarbonate: 30.1 (08/2023) BUN: 16 (08/2023) Creatinine: 0.9 (08/2023) AST: 29 (08/2023) ALT: 44 (08/2023) Alkaline Phosphatase: 59 (08/2023) Hemoglobin: 16.0 (08/2023) WBC: 8.1 (08/2023) Platelets: 235 (08/2023) A1c: 5.3 (08/2023)  RADIOLOGY MRI: White matter changes and small vessel disease  Labs from PCP 09/23/23: TC 116, TG 213, HDL 35, LDL 38  Myoview  10/28/21: EF 55-64%. PVCs pre-during& post test. NO Ischemia of Infarct. -- LOW RISK   Risk Assessment/Calculations:          Physical Exam:   VS:  BP 130/86 (BP Location: Left Arm, Patient Position: Sitting, Cuff Size: Normal)   Pulse (!) 57   Ht 5' 6 (1.676 m)   Wt 185 lb 3.2 oz (84 kg)   SpO2 99%   BMI 29.89 kg/m    Wt Readings from Last 3 Encounters:  12/23/23 185 lb 3.2 oz (84 kg)  12/17/22 184 lb 6.4 oz (83.6 kg)  12/11/21 185 lb (83.9 kg)      GEN: Well nourished, well developed in no acute distress; healthy-appearing. NECK: No JVD; No carotid bruits CARDIAC: Normal S1, S2; RRR, no murmurs, rubs, gallops RESPIRATORY:  Clear to auscultation without rales, wheezing or rhonchi ; nonlabored, good air movement. ABDOMEN: Soft, non-tender, non-distended EXTREMITIES:  No edema; No deformity      ASSESSMENT AND PLAN: .    Problem List Items Addressed This Visit       Cardiology Problems   CAD S/P percutaneous coronary angioplasty - Primary (Chronic)   Atherosclerotic heart disease of native coronary artery without angina  pectoris Coronary artery disease with stents placed 18 years ago. Asymptomatic with no cardiac symptoms. Regular exercise without issues. Continue 81 mg aspirin along with low-dose amlodipine  2.5 mg daily and valsartan -HCTZ 160-25 mg twice daily.   Not on beta-blocker because of underlying bradycardia. Lipids managed by Repatha .  Statin intolerant. No further ischemic evaluation      Relevant Orders   EKG 12-Lead (Completed)   Essential hypertension (Chronic)   Borderline diastolic pressure today but otherwise stable blood pressures on combination of amlodipine  2.5 mg daily and valsartan  HCTZ 160-25 mg which she is taking 1/2 tablet twice daily) Home readings typically 120-130/80-90 mmHg.  Intermittent orthostatic symptoms Dizziness and palpitations upon standing, likely due to orthostatic changes and possible dehydration. Symptoms absent during exercise. Current blood pressure medications and diuretics may contribute. - Increase hydration, especially when consuming  diuretics like coffee.      Hyperlipidemia with target low density lipoprotein (LDL) cholesterol less than 55 mg/dL (Chronic)   Cholesterol levels well-managed with Repatha . Triglycerides increase during summer, likely due to dietary changes. Potential insurance coverage change for Repatha . - Continue Repatha  therapy. - Monitor insurance coverage for Repatha  and consider switching to Praluent if coverage issues arise.        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  Not able to tolerate because of myalgias.  Now on Repatha  and tolerating well.  Excellent control.       Hypothyroidism Well-controlled on 25 mcg of levothyroxine . Recent TSH level 2.75.  Vitamin B12 deficiency Vitamin B12 level low at 269. On over-the-counter B12 supplements. - Continue over-the-counter vitamin B12 supplementation.  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 Carries epinephrine  autoinjector. - Continue carrying epinephrine  autoinjector for emergency use.  \        Follow-Up: Return in about 1 year (around 12/22/2024).     Signed, Alm MICAEL Clay, MD, MS Alm Clay, M.D., M.S. Interventional Cardiologist  [facility] Pager # [phone]

## 2023-12-30 ENCOUNTER — Encounter: Payer: Self-pay | Admitting: Cardiology

## 2023-12-30 NOTE — Assessment & Plan Note (Signed)
 Cholesterol levels well-managed with Repatha . Triglycerides increase during summer, likely due to dietary changes. Potential insurance coverage change for Repatha . - Continue Repatha  therapy. - Monitor insurance coverage for Repatha  and consider switching to Praluent if coverage issues arise.

## 2023-12-30 NOTE — Assessment & Plan Note (Signed)
 Borderline diastolic pressure today but otherwise stable blood pressures on combination of amlodipine  2.5 mg daily and valsartan  HCTZ 160-25 mg which she is taking 1/2 tablet twice daily) Home readings typically 120-130/80-90 mmHg.  Intermittent orthostatic symptoms Dizziness and palpitations upon standing, likely due to orthostatic changes and possible dehydration. Symptoms absent during exercise. Current blood pressure medications and diuretics may contribute. - Increase hydration, especially when consuming diuretics like coffee.

## 2023-12-30 NOTE — Assessment & Plan Note (Signed)
 Attempted use of simvastatin, atorvastatin rosuvastatin as well as gemfibrozil.  Not able to tolerate because of myalgias.  Now on Repatha  and tolerating well.  Excellent control.

## 2023-12-30 NOTE — Assessment & Plan Note (Addendum)
 Atherosclerotic heart disease of native coronary artery without angina pectoris Coronary artery disease with stents placed 18 years ago. Asymptomatic with no cardiac symptoms. Regular exercise without issues. Continue 81 mg aspirin along with low-dose amlodipine  2.5 mg daily and valsartan -HCTZ 160-25 mg twice daily.   Not on beta-blocker because of underlying bradycardia. Lipids managed by Repatha .  Statin intolerant. No further ischemic evaluation

## 2024-01-04 ENCOUNTER — Other Ambulatory Visit: Payer: Self-pay

## 2024-01-04 MED ORDER — FLUZONE HIGH-DOSE 0.5 ML IM SUSY
0.5000 mL | PREFILLED_SYRINGE | Freq: Once | INTRAMUSCULAR | 0 refills | Status: AC
  Filled 2024-01-04: qty 0.5, 1d supply, fill #0

## 2024-01-04 MED ORDER — PREVNAR 20 0.5 ML IM SUSY
0.5000 mL | PREFILLED_SYRINGE | Freq: Once | INTRAMUSCULAR | 0 refills | Status: AC
  Filled 2024-01-04: qty 0.5, 1d supply, fill #0

## 2024-01-10 DIAGNOSIS — D2271 Melanocytic nevi of right lower limb, including hip: Secondary | ICD-10-CM | POA: Diagnosis not present

## 2024-01-10 DIAGNOSIS — L821 Other seborrheic keratosis: Secondary | ICD-10-CM | POA: Diagnosis not present

## 2024-01-10 DIAGNOSIS — D2261 Melanocytic nevi of right upper limb, including shoulder: Secondary | ICD-10-CM | POA: Diagnosis not present

## 2024-01-10 DIAGNOSIS — L570 Actinic keratosis: Secondary | ICD-10-CM | POA: Diagnosis not present

## 2024-01-10 DIAGNOSIS — D225 Melanocytic nevi of trunk: Secondary | ICD-10-CM | POA: Diagnosis not present

## 2024-01-10 DIAGNOSIS — D2262 Melanocytic nevi of left upper limb, including shoulder: Secondary | ICD-10-CM | POA: Diagnosis not present

## 2024-01-10 DIAGNOSIS — D2272 Melanocytic nevi of left lower limb, including hip: Secondary | ICD-10-CM | POA: Diagnosis not present

## 2024-01-14 NOTE — Progress Notes (Signed)
 EBON KETCHUM                                          MRN: [id number]   01/14/2024   The [facility] Quality Team Specialist reviewed this patient medical record for the purposes of chart review for care gap closure. The following were reviewed: abstraction for care gap closure-controlling blood pressure.    [facility] Quality Team

## 2024-01-17 ENCOUNTER — Other Ambulatory Visit: Payer: Self-pay

## 2024-01-17 MED ORDER — AMLODIPINE BESYLATE 2.5 MG PO TABS
2.5000 mg | ORAL_TABLET | Freq: Every day | ORAL | 3 refills | Status: AC
  Filled 2024-01-17: qty 90, 90d supply, fill #0

## 2024-01-26 ENCOUNTER — Other Ambulatory Visit: Payer: Self-pay

## 2024-01-26 MED ORDER — PANTOPRAZOLE SODIUM 40 MG PO TBEC
40.0000 mg | DELAYED_RELEASE_TABLET | Freq: Two times a day (BID) | ORAL | 3 refills | Status: AC
  Filled 2024-01-26: qty 180, 90d supply, fill #0

## 2024-01-28 ENCOUNTER — Other Ambulatory Visit: Payer: Self-pay

## 2024-01-28 MED ORDER — VALSARTAN-HYDROCHLOROTHIAZIDE 160-25 MG PO TABS
0.5000 | ORAL_TABLET | Freq: Two times a day (BID) | ORAL | 3 refills | Status: AC
  Filled 2024-01-28: qty 90, 90d supply, fill #0

## 2024-02-08 ENCOUNTER — Other Ambulatory Visit: Payer: Self-pay

## 2024-02-18 ENCOUNTER — Other Ambulatory Visit: Payer: Self-pay

## 2024-02-18 MED ORDER — LEVOTHYROXINE SODIUM 25 MCG PO TABS
25.0000 ug | ORAL_TABLET | Freq: Every day | ORAL | 1 refills | Status: AC
  Filled 2024-02-18: qty 90, 90d supply, fill #0

## 2024-03-28 ENCOUNTER — Other Ambulatory Visit: Payer: Self-pay

## 2024-03-28 MED ORDER — AMLODIPINE BESYLATE 2.5 MG PO TABS
2.5000 mg | ORAL_TABLET | Freq: Every day | ORAL | 3 refills | Status: AC
  Filled 2024-03-28: qty 90, 90d supply, fill #0

## 2024-04-26 ENCOUNTER — Other Ambulatory Visit: Payer: Self-pay | Admitting: Cardiology

## 2024-04-26 ENCOUNTER — Other Ambulatory Visit: Payer: Self-pay

## 2024-04-27 ENCOUNTER — Other Ambulatory Visit: Payer: Self-pay

## 2024-04-27 MED ORDER — VALSARTAN-HYDROCHLOROTHIAZIDE 160-25 MG PO TABS
0.5000 | ORAL_TABLET | Freq: Two times a day (BID) | ORAL | 3 refills | Status: AC
  Filled 2024-04-27: qty 90, 90d supply, fill #0

## 2024-04-27 MED ORDER — PANTOPRAZOLE SODIUM 40 MG PO TBEC
40.0000 mg | DELAYED_RELEASE_TABLET | Freq: Two times a day (BID) | ORAL | 3 refills | Status: AC
  Filled 2024-04-27: qty 180, 90d supply, fill #0

## 2024-04-28 ENCOUNTER — Other Ambulatory Visit: Payer: Self-pay

## 2024-04-28 MED ORDER — REPATHA SURECLICK 140 MG/ML ~~LOC~~ SOAJ
140.0000 mg | SUBCUTANEOUS | 3 refills | Status: AC
  Filled 2024-04-28: qty 2, 28d supply, fill #0

## 2024-05-01 ENCOUNTER — Other Ambulatory Visit: Payer: Self-pay

## 2024-05-01 ENCOUNTER — Encounter: Payer: Self-pay | Admitting: Cardiology

## 2024-05-01 ENCOUNTER — Other Ambulatory Visit: Payer: Self-pay | Admitting: Emergency Medicine

## 2024-05-03 ENCOUNTER — Other Ambulatory Visit: Payer: Self-pay
